# Patient Record
Sex: Male | Born: 1990 | Race: White | Hispanic: No | Marital: Married | Smoking: Never smoker
Health system: Southern US, Community
[De-identification: ages and names within clinical notes are randomized; demographics above are authoritative.]

## PROBLEM LIST (undated history)

## (undated) DIAGNOSIS — Z789 Other specified health status: Secondary | ICD-10-CM

## (undated) DIAGNOSIS — F988 Other specified behavioral and emotional disorders with onset usually occurring in childhood and adolescence: Secondary | ICD-10-CM

## (undated) HISTORY — DX: Other specified behavioral and emotional disorders with onset usually occurring in childhood and adolescence: F98.8

## (undated) HISTORY — DX: Other specified health status: Z78.9

---
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
## 2013-02-16 IMAGING — CR RIGHT FOOT COMPLETE - 3+ VIEW
1 series · 3 of 3 positions shown · non-contrast
Comparison: none

REASON FOR EXAM: foot pain and injury
COMMENTS:

PROCEDURE:     MDR - MDR FOOT RT COMP W/OBLIQUES  - July 23, 2011  [DATE]
RESULT:     Images of the right foot demonstrate no definite fracture,
dislocation or radiopaque foreign body.

[Series 1: ap · 0.17mm/px · 3 of 3 slices shown]
[im 1/3]
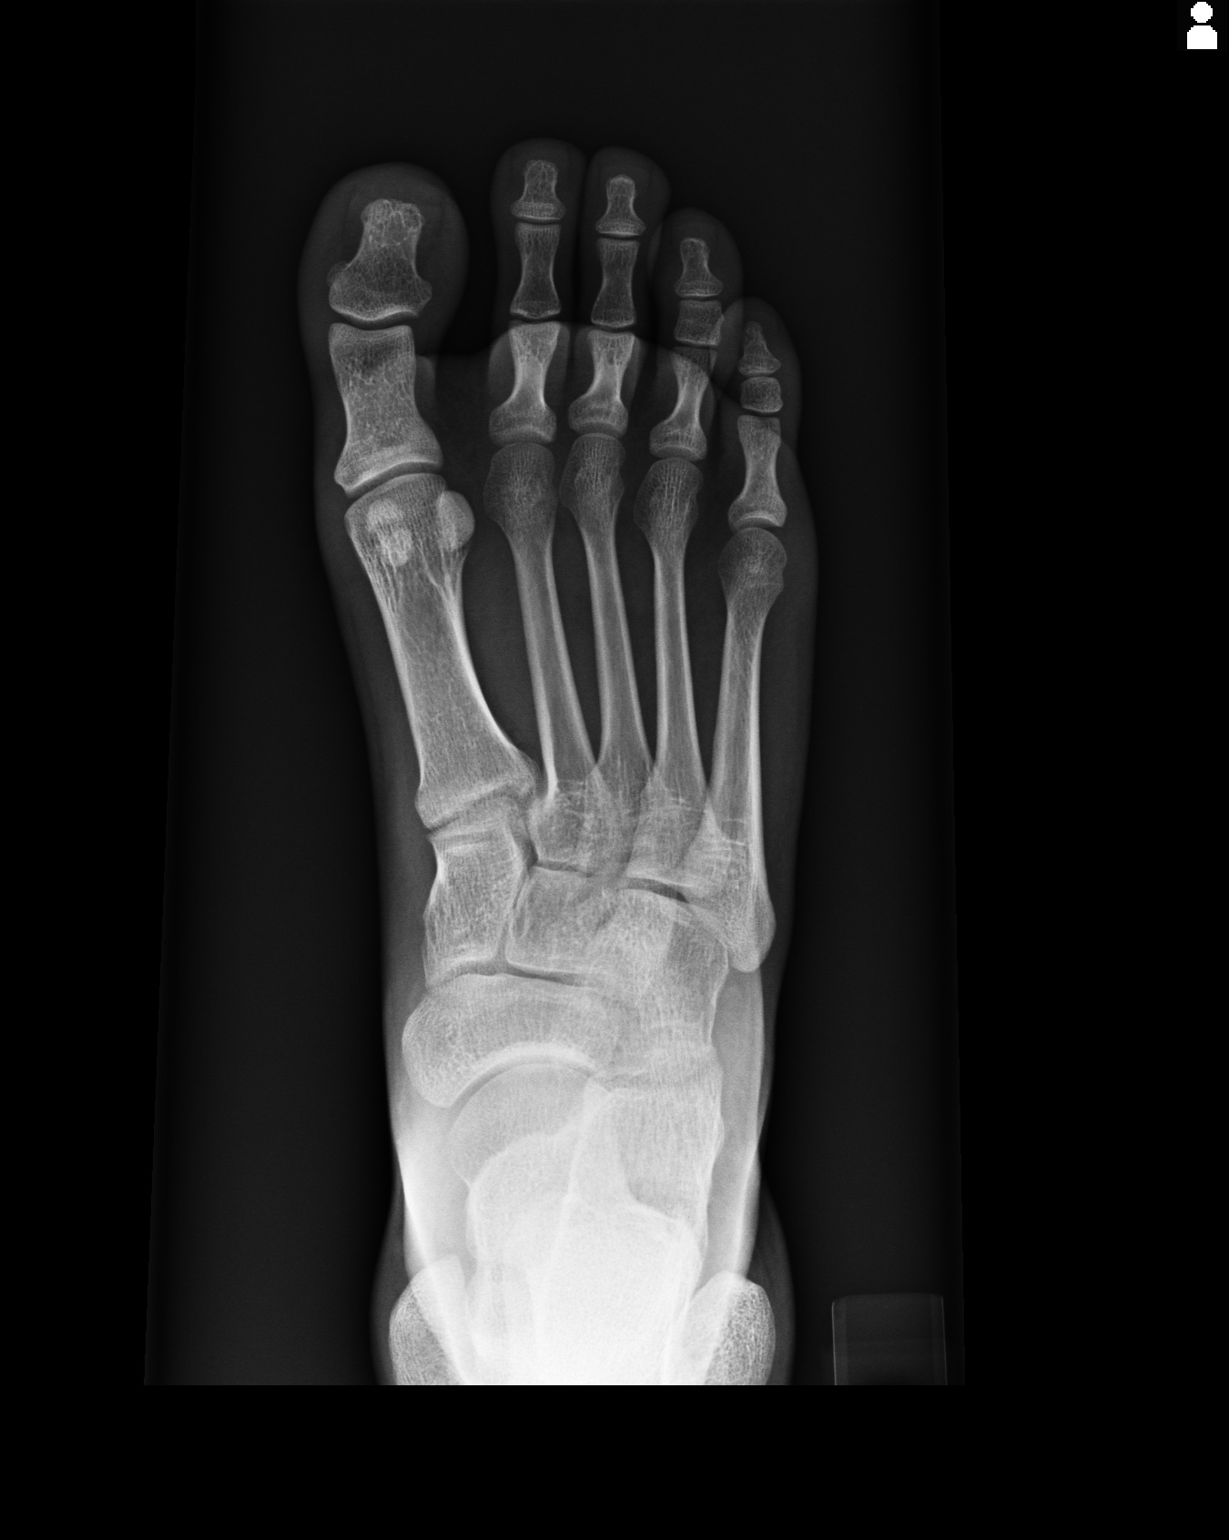
[im 2/3]
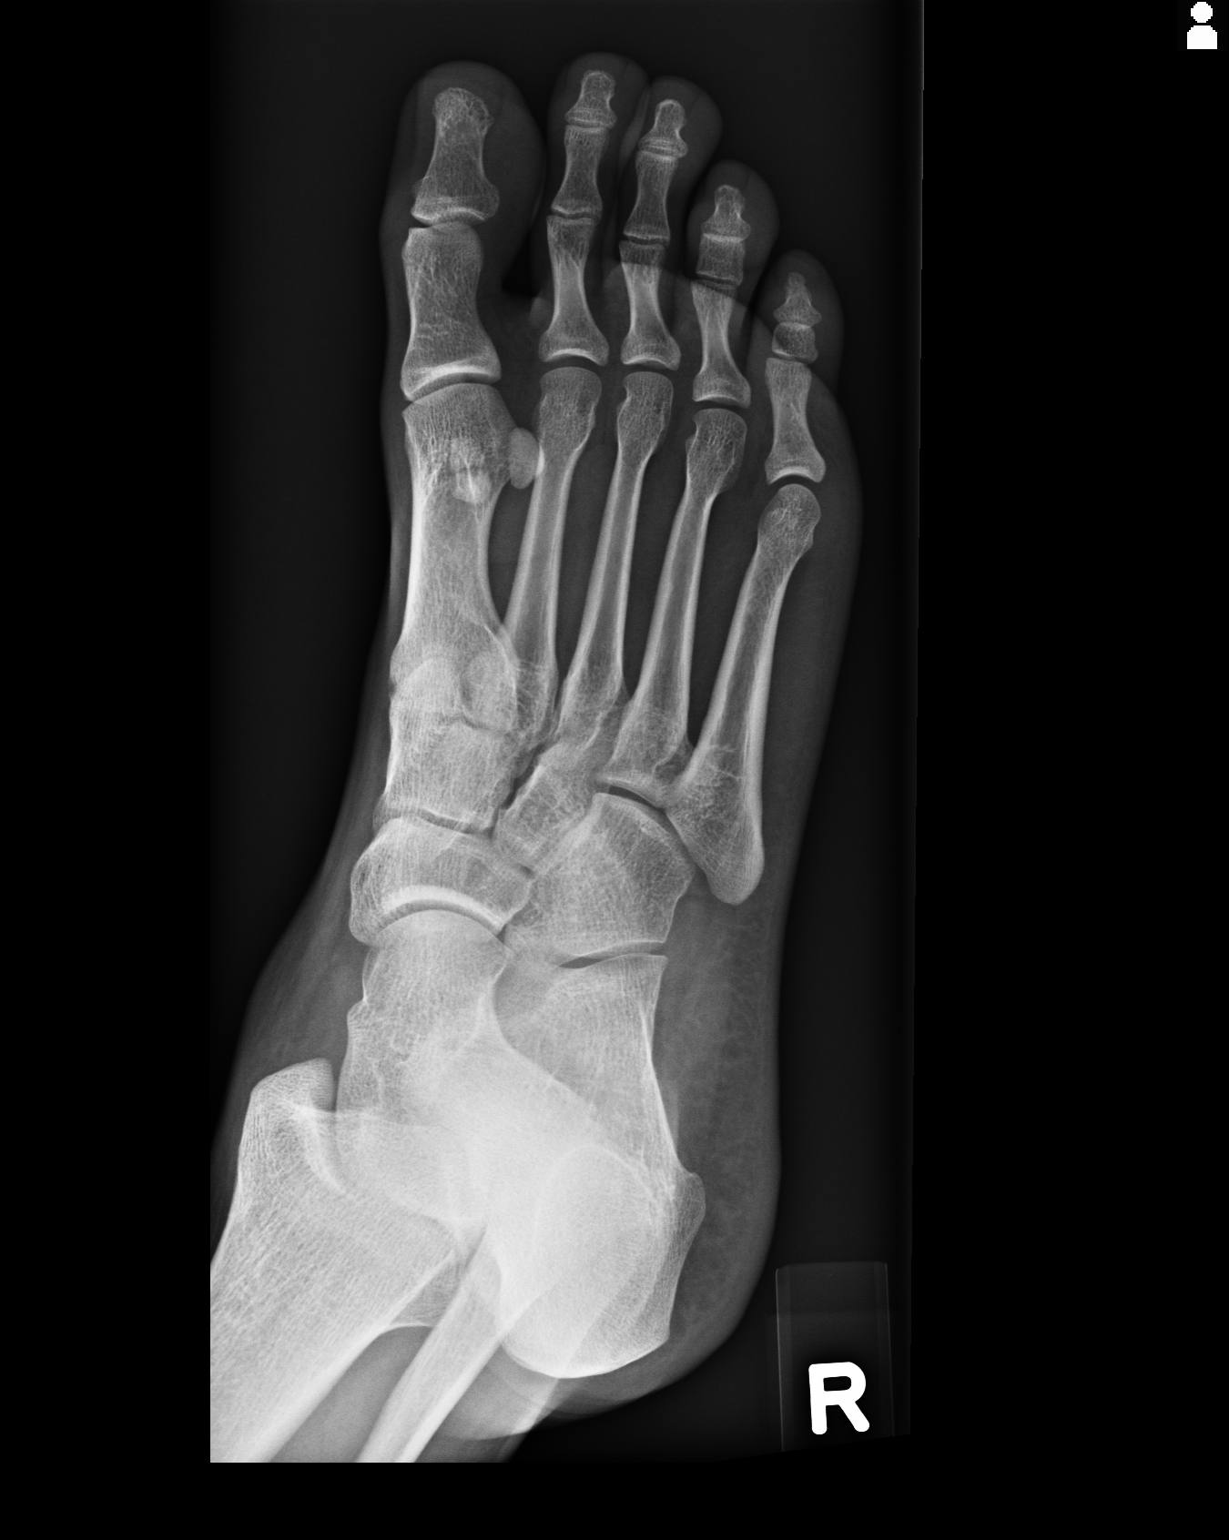
[im 3/3]
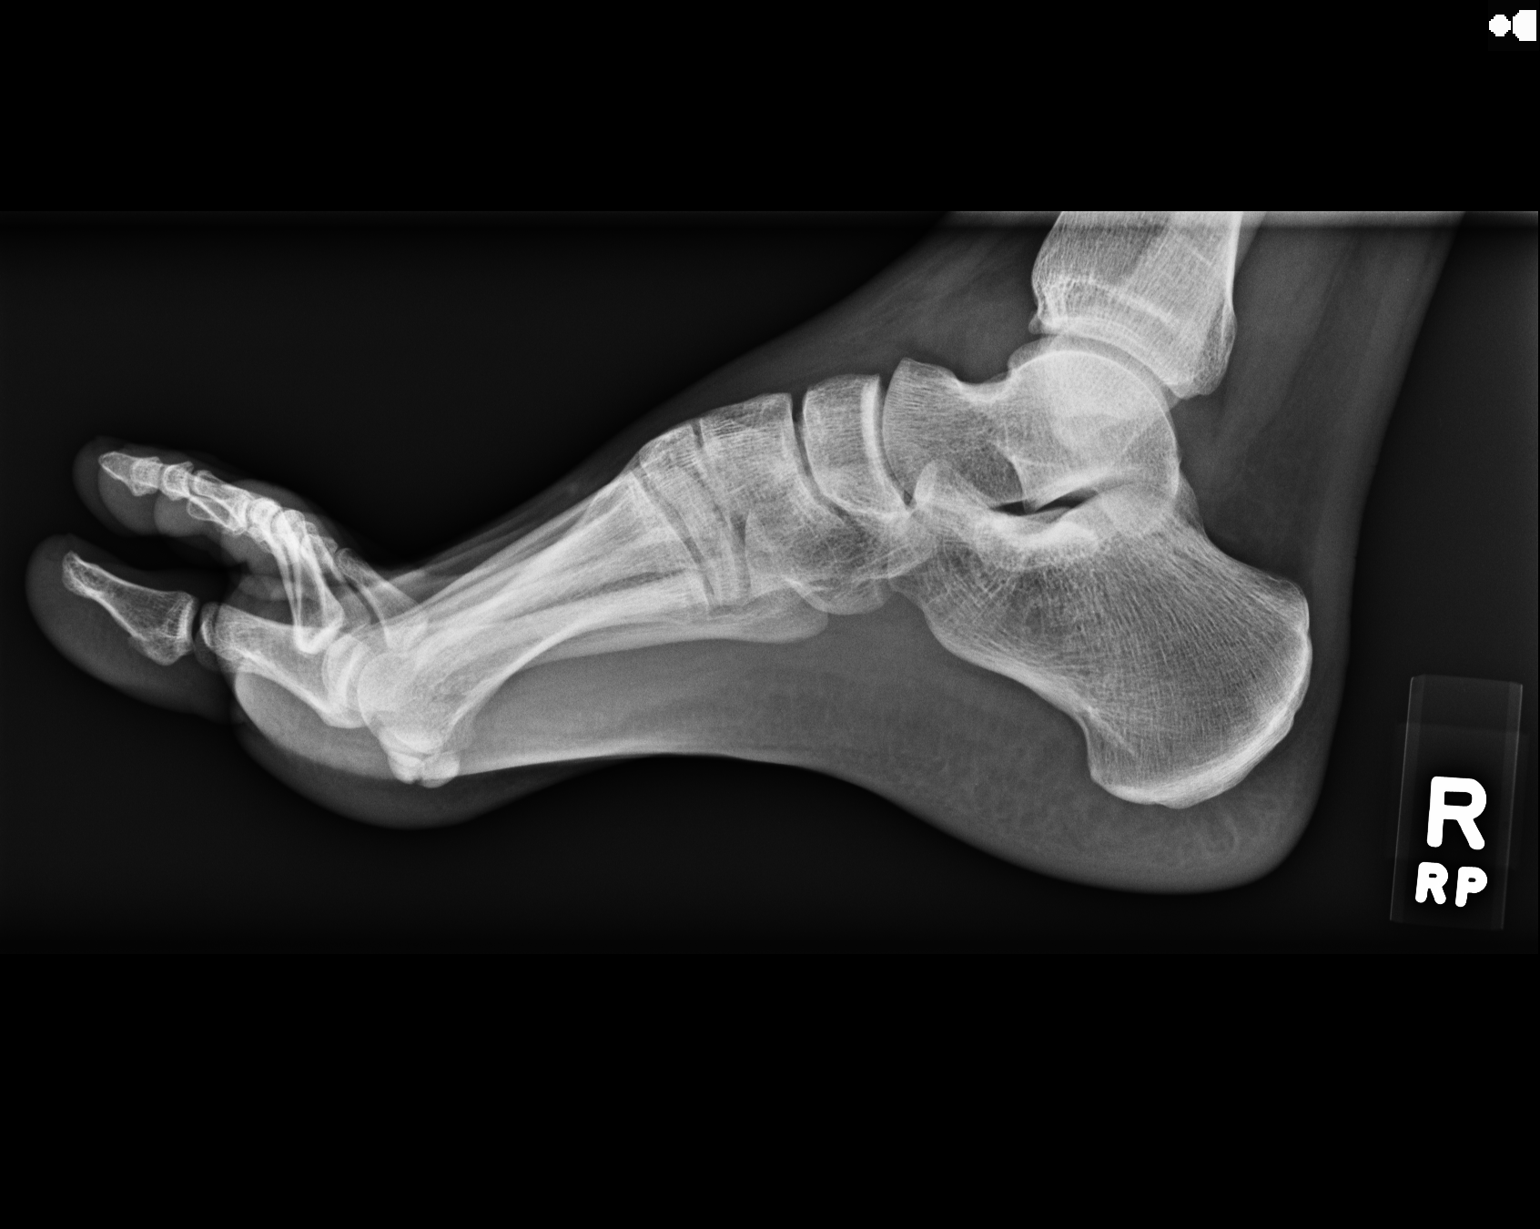

[3 of 3 positions shown; findings below may reference images not displayed]

IMPRESSION: Please see above.

## 2015-04-25 HISTORY — PX: UMBILICAL HERNIA REPAIR: SHX196

## 2017-06-30 LAB — LAB REPORT - SCANNED
ABO GROUPING: 0
ALT: 48
Cholesterol, Total: 176
HDL Cholesterol: 22.6
LDL CHOLESTEROL (CALC): 133
RH TYPE: POSITIVE
Triglycerides: 105

## 2017-08-03 ENCOUNTER — Ambulatory Visit: Payer: Self-pay | Admitting: Family Medicine

## 2017-08-09 ENCOUNTER — Ambulatory Visit (INDEPENDENT_AMBULATORY_CARE_PROVIDER_SITE_OTHER): Admitting: Family Medicine

## 2017-08-09 ENCOUNTER — Encounter: Payer: Self-pay | Admitting: Family Medicine

## 2017-08-09 VITALS — BP 116/84 | HR 74 | Temp 98.6°F | Ht 72.0 in | Wt 205.5 lb

## 2017-08-09 DIAGNOSIS — Z0000 Encounter for general adult medical examination without abnormal findings: Secondary | ICD-10-CM | POA: Diagnosis not present

## 2017-08-09 DIAGNOSIS — Z7189 Other specified counseling: Secondary | ICD-10-CM

## 2017-08-09 NOTE — Patient Instructions (Addendum)
[place]   Take care.  Glad to see you.  Update me as needed.

## 2017-08-12 ENCOUNTER — Encounter: Payer: Self-pay | Admitting: Family Medicine

## 2017-08-12 DIAGNOSIS — Z7189 Other specified counseling: Secondary | ICD-10-CM | POA: Insufficient documentation

## 2017-08-12 DIAGNOSIS — Z0000 Encounter for general adult medical examination without abnormal findings: Secondary | ICD-10-CM | POA: Insufficient documentation

## 2017-08-12 NOTE — Assessment & Plan Note (Addendum)
Tetanus 05/2017 Flu 2018 PNA and shingles not due Prostate and colon cancer screening not due.  Living will d/w pt.  Wife designated if patient were incapacitated.  Diet and exercise d/w pt, doing well with both.   [facility] report.  He'll get me a copy of labs done at work.

## 2017-08-12 NOTE — Progress Notes (Signed)
New patient to est care.   CPE- See plan.  Routine anticipatory guidance given to patient.  See health maintenance.  The possibility exists that previously documented standard health maintenance information may have been brought forward from a previous encounter into this note.  If needed, that same information has been updated to reflect the current situation based on today's encounter.    Tetanus 05/2017 Flu 2018 PNA and shingles not due Prostate and colon cancer screening not due.  Living will d/w pt.  Wife designated if patient were incapacitated.  Diet and exercise d/w pt, doing well with both.   [facility] per [facility] report.  He'll get me a copy of labs done at work.   PMH and SH reviewed  Meds, vitals, and allergies reviewed.   ROS: Per HPI.  Unless specifically indicated otherwise in HPI, the patient denies:  General: fever. Eyes: acute vision changes ENT: sore throat Cardiovascular: chest pain Respiratory: SOB GI: vomiting GU: dysuria Musculoskeletal: acute back pain Derm: acute rash Neuro: acute motor dysfunction Psych: worsening mood Endocrine: polydipsia Heme: bleeding Allergy: hayfever  GEN: nad, alert and oriented HEENT: mucous membranes moist NECK: supple w/o LA CV: rrr. PULM: ctab, no inc wob ABD: soft, +bs EXT: no edema SKIN: no acute rash

## 2017-08-12 NOTE — Assessment & Plan Note (Signed)
Living will d/w pt.  Wife designated if patient were incapacitated.   ?

## 2017-08-15 ENCOUNTER — Encounter: Payer: Self-pay | Admitting: Family Medicine

## 2017-08-15 ENCOUNTER — Ambulatory Visit (INDEPENDENT_AMBULATORY_CARE_PROVIDER_SITE_OTHER): Admitting: Family Medicine

## 2017-08-15 VITALS — BP 120/84 | HR 78 | Temp 98.2°F | Ht 72.0 in | Wt 208.5 lb

## 2017-08-15 DIAGNOSIS — J302 Other seasonal allergic rhinitis: Secondary | ICD-10-CM | POA: Diagnosis not present

## 2017-08-15 DIAGNOSIS — B353 Tinea pedis: Secondary | ICD-10-CM | POA: Diagnosis not present

## 2017-08-15 MED ORDER — CLOTRIMAZOLE-BETAMETHASONE 1-0.05 % EX CREA: 1.0000 "application " | TOPICAL_CREAM | Freq: Two times a day (BID) | CUTANEOUS | 0 refills | Status: DC

## 2017-08-15 MED ORDER — TERBINAFINE HCL 250 MG PO TABS: 250.0000 mg | ORAL_TABLET | Freq: Every day | ORAL | 0 refills | Status: DC

## 2017-08-15 MED ORDER — FLUTICASONE PROPIONATE 50 MCG/ACT NA SUSP: 2.0000 | Freq: Every day | NASAL | 6 refills | Status: DC

## 2017-08-15 NOTE — Assessment & Plan Note (Signed)
Worsened this year, breakthrough symptoms despite daily antihistamine. Discussed allergen avoidance. rec start flonase and use nasal saline irrigation throughout the day.

## 2017-08-15 NOTE — Assessment & Plan Note (Signed)
Recurrent persistent bilateral athlete's foot, also may have tinea manum. Rx lotrisone for feet, limited 2 wks at a time, and terbinafine 250mg  daily x 2 wks. Update with effect.

## 2017-08-15 NOTE — Patient Instructions (Addendum)
Work on allergen avoidance for seasonal allergies. Continue zyrtec. Add flonase - sent to pharmacy.  For athlete's foot - treat with topical combination antifungal/steroid cream sent to pharmacy - use twice daily for 2 weeks then stop. Also start taking oral antifungal sent to pharmacy. Update us with effect after a month.   Athlete's Foot Athlete's foot (tinea pedis) is a fungal infection of the skin on the feet. It often occurs on the skin that is between or underneath the toes. It can also occur on the soles of the feet. The infection can spread from person to person (is contagious). What are the causes? Athlete's foot is caused by a fungus. This fungus grows in warm, moist places. Most people get athlete's foot by sharing shower stalls, towels, and wet floors with someone who is infected. Not washing your feet or changing your socks often enough can contribute to athlete's foot. What increases the risk? This condition is more likely to develop in:  Men.  People who have a weak body defense system (immune system).  People who have diabetes.  People who use public showers, such as at a gym.  People who wear heavy-duty shoes, such as Youth workerindustrial or military shoes.  Seasons with warm, humid weather.  What are the signs or symptoms? Symptoms of this condition include:  Itchy areas between the toes or on the soles of the feet.  White, flaky, or scaly areas between the toes or on the soles of the feet.  Very itchy small blisters between the toes or on the soles of the feet.  Small cuts on the skin. These cuts can become infected.  Thick or discolored toenails.  How is this diagnosed? This condition is diagnosed with a medical history and physical exam. Your health care provider may also take a skin or toenail sample to be examined. How is this treated? Treatment for this condition includes antifungal medicines. These may be applied as powders, ointments, or creams. In severe cases,  an oral antifungal medicine may be given. Follow these instructions at home:  Apply or take over-the-counter and prescription medicines only as told by your health care provider.  Keep all follow-up visits as told by your health care provider. This is important.  Do not scratch your feet.  Keep your feet dry: ? Wear cotton or wool socks. Change your socks every day or if they become wet. ? Wear shoes that allow air to circulate, such as sandals or canvas tennis shoes.  Wash and dry your feet: ? Every day or as told by your health care provider. ? After exercising. ? Including the area between your toes.  Do not share towels, nail clippers, or other personal items that touch your feet with others.  If you have diabetes, keep your blood sugar under control. How is this prevented?  Do not share towels.  Wear sandals in wet areas, such as locker rooms and shared showers.  Keep your feet dry: ? Wear cotton or wool socks. Change your socks every day or if they become wet. ? Wear shoes that allow air to circulate, such as sandals or canvas tennis shoes.  Wash and dry your feet after exercising. Pay attention to the area between your toes. Contact a health care provider if:  You have a fever.  You have swelling, soreness, warmth, or redness in your foot.  You are not getting better with treatment.  Your symptoms get worse.  You have new symptoms. This information is not intended  to replace advice given to you by your health care provider. Make sure you discuss any questions you have with your health care provider. Document Released: 04/07/2000 Document Revised: 09/16/2015 Document Reviewed: 10/12/2014 Elsevier Interactive Patient Education  2018 [employer].

## 2017-08-15 NOTE — Progress Notes (Signed)
BP 120/84 (BP Location: Left Arm, Patient Position: Sitting, Cuff Size: Normal)   Pulse 78   Temp 98.2 F (36.8 C) (Oral)   Ht 6' (1.829 m)   Wt 208 lb 8 oz (94.6 kg)   SpO2 96%   BMI 28.28 kg/m    CC: itchy feet, worsening allergies Subjective:    Patient ID: Taylor Mcneil, male    DOB: January 16, 1991, 27 y.o.   MRN: [id number]  HPI: Taylor Mcneil is a 27 y.o. male presenting on 08/15/2017 for Foot issue (C/o constant itching and peeling/tearing skin for years. Tried OTC antifungal and rx antifungal, not helpful.  Tried wife's steroid cream for eczema that helped but did not resolve issue.) and Allergies (Has seasonal allergies.  Tried Zyrtec, not helping.)   Longstanding history of itchy rash on soles, peeling of feet. Ongoing for years, worse at night time. Tried antifungal cream prescribed by [facility] hospital twice daily for 1 year without benefit. Tried wife's eczema cream which temporarily helps itching. Denies itchy rash elsewhere - groin, hands.  Allergic rhinitis - worsening symptoms this season - this year has been especially bad for pine pollen. Nasal congestion, rhinorrhea, itchy watery eyes, sneezing. Some days are better than other. Worse around trees or working outdoors. Taking zyrtec daily x 2 months without benefit.   Relevant past medical, surgical, family and social history reviewed and updated as indicated. Interim medical history since our last visit reviewed. Allergies and medications reviewed and updated. Outpatient Medications Prior to Visit  Medication Sig Dispense Refill  . cetirizine (ZYRTEC) 10 MG tablet Take 10 mg by mouth daily.     No facility-administered medications prior to visit.      Per HPI unless specifically indicated in ROS section below Review of Systems     Objective:    BP 120/84 (BP Location: Left Arm, Patient Position: Sitting, Cuff Size: Normal)   Pulse 78   Temp 98.2 F (36.8 C) (Oral)   Ht 6' (1.829 m)   Wt 208 lb 8 oz (94.6 kg)    SpO2 96%   BMI 28.28 kg/m   Wt Readings from Last 3 Encounters:  08/15/17 208 lb 8 oz (94.6 kg)  08/09/17 205 lb 8 oz (93.2 kg)    Physical Exam  Constitutional: He appears well-developed and well-nourished. No distress.  Skin: Skin is warm and dry. Rash noted.  Dry skin throughout hands and feet, hyperkeratotic patches medial feet, interdigital maceration, fissure between great and 2nd toe on right, pruritic scaly soles  Nursing note and vitals reviewed.  No results found for this or any previous visit.    Assessment & Plan:   Problem List Items Addressed This Visit    Seasonal allergic rhinitis    Worsened this year, breakthrough symptoms despite daily antihistamine. Discussed allergen avoidance. rec start flonase and use nasal saline irrigation throughout the day.       Tinea pedis - Primary    Recurrent persistent bilateral athlete's foot, also may have tinea manum. Rx lotrisone for feet, limited 2 wks at a time, and terbinafine 250mg  daily x 2 wks. Update with effect.       Relevant Medications   clotrimazole-betamethasone (LOTRISONE) cream   terbinafine (LAMISIL) 250 MG tablet       Meds ordered this encounter  Medications  . fluticasone (FLONASE) 50 MCG/ACT nasal spray    Sig: Place 2 sprays into both nostrils daily.    Dispense:  16 g    Refill:  6  . clotrimazole-betamethasone (LOTRISONE) cream    Sig: Apply 1 application topically 2 (two) times daily. For max 2 wks at a time then may start plain lotrimin    Dispense:  45 g    Refill:  0  . terbinafine (LAMISIL) 250 MG tablet    Sig: Take 1 tablet (250 mg total) by mouth daily.    Dispense:  14 tablet    Refill:  0   No orders of the defined types were placed in this encounter.   Follow up plan: Return if symptoms worsen or fail to improve.  Taylor BoydenJavier Armetta Henri, MD

## 2017-08-16 ENCOUNTER — Telehealth: Payer: Self-pay | Admitting: Family Medicine

## 2017-08-16 MED ORDER — CETIRIZINE HCL 10 MG PO TABS: 10.0000 mg | ORAL_TABLET | Freq: Every day | ORAL | 3 refills | Status: DC

## 2017-08-16 NOTE — Telephone Encounter (Signed)
Amber notified as instructed and voiced understanding.

## 2017-08-16 NOTE — Telephone Encounter (Signed)
Copied from CRM [id number]. Topic: Quick Communication - Rx Refill/Question >> Aug 16, 2017  1:14 PM Maia Pettiesrtiz, Kristie S wrote: Medication: zyrtec was not sent to pharmacy with the other medications - please send in Has the patient contacted their pharmacy? Yes - not received Preferred Pharmacy (with phone number or street name): [facility] [phone] - [place] - [address] [facility] [address]

## 2017-08-16 NOTE — Telephone Encounter (Signed)
Noted  

## 2017-08-16 NOTE — Telephone Encounter (Signed)
Pt was seen 08/16/17; zyrtec not working; pt to use flonase and nasal saline irrigation.Please advise.

## 2017-08-16 NOTE — Telephone Encounter (Signed)
plz notify zyrtec sent in. Take with other meds as well.

## 2017-08-22 ENCOUNTER — Ambulatory Visit: Admitting: Family Medicine

## 2017-08-23 ENCOUNTER — Encounter: Payer: Self-pay | Admitting: Family Medicine

## 2018-09-27 ENCOUNTER — Other Ambulatory Visit: Payer: Self-pay | Admitting: Family Medicine

## 2018-12-03 ENCOUNTER — Ambulatory Visit (INDEPENDENT_AMBULATORY_CARE_PROVIDER_SITE_OTHER): Admitting: Family Medicine

## 2018-12-03 ENCOUNTER — Encounter: Payer: Self-pay | Admitting: Family Medicine

## 2018-12-03 ENCOUNTER — Other Ambulatory Visit: Payer: Self-pay

## 2018-12-03 DIAGNOSIS — M545 Low back pain, unspecified: Secondary | ICD-10-CM

## 2018-12-03 MED ORDER — TIZANIDINE HCL 4 MG PO TABS: 4.0000 mg | ORAL_TABLET | Freq: Three times a day (TID) | ORAL | 0 refills | Status: DC | PRN

## 2018-12-03 NOTE — Patient Instructions (Signed)
Use tizanidine and heat and update me as needed.  Likely a muscle strain/spasm.  Take care.  Glad to see you.

## 2018-12-03 NOTE — Progress Notes (Signed)
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  He was able to lay off lifting for 1 week.  He was better and able to go back to the gym. Then he was lifting a box at home and felt a pop. That was about 2 weeks ago.  He feels better standing and laying down.  More pain sitting or with toe touches.  No radicular pain. The pop was just above beltline to R of midline. The pain is local, in the same are.  No FCNAVD.  He tried ibuprofen, stretching, icy hot, heat.  Heat helps some.  Overall time line ~5 weeks.  He is on leave until 01/02/2019.  No numbness, no weakness.  No B/B sx.   He had reported issue to his department.    Meds, vitals, and allergies reviewed.   ROS: Per HPI unless specifically indicated in ROS section   nad ncat rrr ctab abd soft, not ttp Back not tender to palpation in the midline R lower back ttp.  No CVA pain No rash.  No bruising. Strength and sensation grossly normal for the bilateral lower extremities with negative straight leg raise bilaterally.

## 2018-12-04 DIAGNOSIS — M545 Low back pain, unspecified: Secondary | ICD-10-CM | POA: Insufficient documentation

## 2018-12-04 NOTE — Assessment & Plan Note (Signed)
No emergent symptoms.  No sciatica.  Discussed options.  Okay to defer imaging at this point Use tizanidine and heat and update me as needed.  Sedation caution on tizanidine. Likely a muscle strain/spasm.  He agrees with plan.

## 2018-12-10 ENCOUNTER — Emergency Department
Admission: EM | Admit: 2018-12-10 | Discharge: 2018-12-10 | Disposition: A | Discharge status: Home / Self Care | Attending: Emergency Medicine | Admitting: Emergency Medicine

## 2018-12-10 ENCOUNTER — Encounter: Payer: Self-pay | Admitting: Physician Assistant

## 2018-12-10 ENCOUNTER — Other Ambulatory Visit: Payer: Self-pay

## 2018-12-10 ENCOUNTER — Emergency Department

## 2018-12-10 DIAGNOSIS — M545 Low back pain, unspecified: Secondary | ICD-10-CM

## 2018-12-10 MED ORDER — PREDNISONE 10 MG (21) PO TBPK: ORAL_TABLET | ORAL | 0 refills | Status: DC

## 2018-12-10 MED ORDER — CYCLOBENZAPRINE HCL 10 MG PO TABS: 10.0000 mg | ORAL_TABLET | Freq: Three times a day (TID) | ORAL | 0 refills | Status: DC | PRN

## 2018-12-10 NOTE — ED Triage Notes (Signed)
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

## 2018-12-10 NOTE — Discharge Instructions (Addendum)
Follow-up with Dr. Lacinda Axon if not better in 3 to 5 days.  Please call for an appointment.  Return emergency department worsening.  Take medications as prescribed.  Apply ice to the lower back.  You could then start some back exercises as inflammation decreases.

## 2018-12-10 NOTE — ED Provider Notes (Signed)
[facility] Emergency Department Provider Note  ____________________________________________   None    (approximate)  I have reviewed the triage vital signs and the nursing notes.   HISTORY  Chief Complaint Back Pain    HPI Taylor Mcneil is a [age] y.o. male complains of low back pain.  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  He had some back pain for last 3 weeks while at work.  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  When he returned home he picks him up and felt a pop in his back.  To the right leg.  He has been taking ibuprofen and his regular doctor gave him Zanaflex which is not helped.  He denies any bowel or bladder changes.  No numbness or tingling.  Pain radiates to the right buttock only.    Past Medical History:  Diagnosis Date  . No pertinent past medical history     Patient Active Problem List   Diagnosis Date Noted  . Lower back pain 12/04/2018  . Tinea pedis 08/15/2017  . Seasonal allergic rhinitis 08/15/2017  . Routine general medical examination at a health care facility 08/12/2017  . Advance care planning 08/12/2017    Past Surgical History:  Procedure Laterality Date  . UMBILICAL HERNIA REPAIR  2017    Prior to Admission medications   Medication Sig Start Date End Date Taking? Authorizing Provider  cyclobenzaprine (FLEXERIL) 10 MG tablet Take 1 tablet (10 mg total) by mouth 3 (three) times daily as needed. 12/10/18   Finnigan Warriner, Roselyn BeringSusan W, PA-C  predniSONE (STERAPRED UNI-PAK 21 TAB) 10 MG (21) TBPK tablet Take 6 pills on day one then decrease by 1 pill each day 12/10/18   Faythe GheeFisher, Geni Skorupski W, PA-C  tiZANidine (ZANAFLEX) 4 MG tablet Take 1 tablet (4 mg total) by mouth 3 (three) times daily as needed for muscle spasms (sedation caution.). 12/03/18   Joaquim Namuncan, Graham S, MD    Allergies Patient has no known allergies.  Family History  Problem Relation Age of Onset  . Lung cancer Paternal Aunt        smoker  . Lung cancer  Paternal Grandmother        smoker  . Lung cancer Paternal Grandfather        smoker  . Colon cancer Neg Hx   . Prostate cancer Neg Hx     Social History Social History   Tobacco Use  . Smoking status: Never Smoker  . Smokeless tobacco: Never Used  Substance Use Topics  . Alcohol use: Never    Frequency: Never  . Drug use: Never    Review of Systems  Constitutional: No fever/chills Eyes: No visual changes. ENT: No sore throat. Respiratory: Denies cough Genitourinary: Negative for dysuria. Musculoskeletal: Positive for back pain. Skin: Negative for rash.    ____________________________________________   PHYSICAL EXAM:  VITAL SIGNS: ED Triage Vitals  Enc Vitals Group     BP 12/10/18 1029 127/78     Pulse Rate 12/10/18 1029 75     Resp 12/10/18 1029 16     Temp 12/10/18 1029 98.2 F (36.8 C)     Temp Source 12/10/18 1029 Oral     SpO2 12/10/18 1029 98 %     Weight 12/10/18 1024 191 lb (86.6 kg)     Height 12/10/18 1024 6\' 1"  (1.854 m)     Head Circumference --      Peak Flow --  Pain Score 12/10/18 1024 5     Pain Loc --      Pain Edu? --      Excl. in GC? --     Constitutional: Alert and oriented. Well appearing and in no acute distress. Eyes: Conjunctivae are normal.  Head: Atraumatic. Nose: No congestion/rhinnorhea. Mouth/Throat: Mucous membranes are moist.   Neck:  supple no lymphadenopathy noted Cardiovascular: Normal rate, regular rhythm.  Respiratory: Normal respiratory effort.  No retractions GU: deferred Musculoskeletal: FROM all extremities, warm and well perfused, lumbar spine is slightly tender to palpation, patient is able to stand on toes and heels without difficulty, neurovascular is intact, Neurologic:  Normal speech and language.  Skin:  Skin is warm, dry and intact. No rash noted. Psychiatric: Mood and affect are normal. Speech and behavior are normal.  ____________________________________________   LABS (all labs ordered  are listed, but only abnormal results are displayed)  Labs Reviewed - No data to display ____________________________________________   ____________________________________________  RADIOLOGY  X-ray of the lumbar spine does not show any acute abnormality  ____________________________________________   PROCEDURES  Procedure(s) performed: No  Procedures    ____________________________________________   INITIAL IMPRESSION / ASSESSMENT AND PLAN / ED COURSE  Pertinent labs & imaging results that were available during my care of the patient were reviewed by me and considered in my medical decision making (see chart for details).   Patient is a 28 year old male presents emergency department with complaints of low back pain.  Physical exam shows the patient to appear well.  Lumbar spine is tender.  Neurovascular is intact.  X-ray lumbar spine is negative for any acute abnormality  Explained findings to the patient.  He was placed on a Sterapred Dosepak and I changed his muscle relaxer to Flexeril.  He is to follow-[facility] if he is not better in 3 to 5 days.  Return emergency department if worsening.  Discussed signs and symptoms of cauda equina.  He states he understands and will comply.  Was discharged    [name] was evaluated in Emergency Department on 12/10/2018 for the symptoms described in the history of present illness. He was evaluated in the context of the global COVID-19 pandemic, which necessitated consideration that the patient might be at risk for infection with the SARS-CoV-2 virus that causes COVID-19. Institutional protocols and algorithms that pertain to the evaluation of patients at risk for COVID-19 are in a state of rapid change based on information released by regulatory bodies including the CDC and federal and state organizations. These policies and algorithms were followed during the patient's care in  the ED.   As part of my medical decision making, I reviewed the following data within the electronic MEDICAL RECORD NUMBER Nursing notes reviewed and incorporated, Old chart reviewed, Radiograph reviewed x-ray lumbar spine is negative, Notes from prior ED visits and [place] Controlled Substance Database  ____________________________________________   FINAL CLINICAL IMPRESSION(S) / ED DIAGNOSES  Final diagnoses:  Acute midline low back pain without sciatica      NEW MEDICATIONS STARTED DURING THIS VISIT:  Discharge Medication List as of 12/10/2018 11:24 AM    START taking these medications   Details  cyclobenzaprine (FLEXERIL) 10 MG tablet Take 1 tablet (10 mg total) by mouth 3 (three) times daily as needed., Starting Tue 12/10/2018, Normal    predniSONE (STERAPRED UNI-PAK 21 TAB) 10 MG (21) TBPK tablet Take 6 pills on day one then  decrease by 1 pill each day, Normal         Note:  This document was prepared using Dragon voice recognition software and may include unintentional dictation errors.    Versie Starks, PA-C 12/10/18 1132    Carrie Mew, MD 12/10/18 2033

## 2018-12-10 NOTE — ED Notes (Signed)
Pt discharged home after verbalizing understanding of discharge instructions; nad noted. 

## 2018-12-11 ENCOUNTER — Ambulatory Visit: Admitting: Family Medicine

## 2018-12-18 ENCOUNTER — Other Ambulatory Visit: Payer: Self-pay | Admitting: Student

## 2018-12-18 DIAGNOSIS — M5441 Lumbago with sciatica, right side: Secondary | ICD-10-CM

## 2018-12-27 ENCOUNTER — Ambulatory Visit

## 2019-02-13 ENCOUNTER — Ambulatory Visit (INDEPENDENT_AMBULATORY_CARE_PROVIDER_SITE_OTHER): Admitting: Family Medicine

## 2019-02-13 ENCOUNTER — Encounter: Payer: Self-pay | Admitting: Family Medicine

## 2019-02-13 ENCOUNTER — Other Ambulatory Visit: Payer: Self-pay

## 2019-02-13 VITALS — BP 136/82 | HR 68 | Temp 97.6°F | Ht 73.0 in | Wt 204.2 lb

## 2019-02-13 DIAGNOSIS — F988 Other specified behavioral and emotional disorders with onset usually occurring in childhood and adolescence: Secondary | ICD-10-CM | POA: Diagnosis not present

## 2019-02-13 MED ORDER — METHYLPHENIDATE HCL 10 MG PO TABS: 10.0000 mg | ORAL_TABLET | Freq: Two times a day (BID) | ORAL | 0 refills | Status: DC

## 2019-02-13 NOTE — Patient Instructions (Signed)
I would try taking ritalin 10mg  a day initially.  You can increase to twice a day if needed.  See if you can tolerate this, see if and how long it works and let me know.  Try to gradually cut back on caffeine.  Keep making lists.   Take care.  Glad to see you.

## 2019-02-13 NOTE — Progress Notes (Signed)
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    History of ADD.  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  He was on ritalin in high school.  He has been off medicine for about 8 years.  His wife wanted him to come in and discuss his situation.  He wanted to talk about it.  Persistently easily distracted.  Both he and his wife have noticed it.  He has trouble focusing with reading but has been able to compensate at work.  He is on a schedule, uses lists and reminders.  Sleeping "fine."  Mult doses of caffeine in a day at baseline.  No illicits.  His previous back pain resolved.  PMH and SH reviewed  ROS: Per HPI unless specifically indicated in ROS section   Meds, vitals, and allergies reviewed.   GEN: nad, alert and oriented HEENT: ncat NECK: supple w/o LA CV: rrr.  PULM: ctab, no inc wob ABD: soft, +bs EXT: no edema SKIN: no acute rash CN 2-12 wnl B, S/S/DTR wnl B

## 2019-02-16 ENCOUNTER — Encounter: Payer: Self-pay | Admitting: Family Medicine

## 2019-02-16 DIAGNOSIS — F988 Other specified behavioral and emotional disorders with onset usually occurring in childhood and adolescence: Secondary | ICD-10-CM | POA: Insufficient documentation

## 2019-02-16 NOTE — Assessment & Plan Note (Signed)
Longstanding, previously noted by patient.  Previously tested, positive, years ago.  He has been trying to compensate for his symptoms in the meantime.  He makes a list, uses reminders, gets exercise, has a healthy diet, but is still having significant trouble to the point where he wanted to come in and talk about it.  His wife has also noticed that he has difficulty with concentration.  Discussed options.  I do not think it makes sense to send him for testing again, since it was clearly positive per his report previously.  He is not using illicit medications.  Routine cautions given on stimulants.  We talked about stimulant versus nonstimulant medication.  I will start with methylphenidate 10 mg once a day and see if he can tolerate that.  He can try dosing it twice a day thereafter to see how much effect he has.  He will update me and we will go from there.  I want him to continue working on diet and exercise and using list, etc.  He agrees with plan.

## 2019-10-09 ENCOUNTER — Other Ambulatory Visit: Payer: Self-pay

## 2019-10-09 ENCOUNTER — Ambulatory Visit (INDEPENDENT_AMBULATORY_CARE_PROVIDER_SITE_OTHER): Admitting: Family Medicine

## 2019-10-09 ENCOUNTER — Encounter: Payer: Self-pay | Admitting: Family Medicine

## 2019-10-09 DIAGNOSIS — F988 Other specified behavioral and emotional disorders with onset usually occurring in childhood and adolescence: Secondary | ICD-10-CM

## 2019-10-09 MED ORDER — METHYLPHENIDATE HCL 20 MG PO TABS: 10.0000 mg | ORAL_TABLET | Freq: Two times a day (BID) | ORAL | 0 refills | Status: DC

## 2019-10-09 NOTE — Progress Notes (Signed)
This visit occurred during the SARS-CoV-2 public health emergency.  Safety protocols were in place, including screening questions prior to the visit, additional usage of staff PPE, and extensive cleaning of exam room while observing appropriate contact time as indicated for disinfecting solutions.  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    ADD f/u.  He thought it helped but he had limited effect.  No ADE on med.  Appetite and sleep are good.  No tremor.  He can tell some effect from med.  No tachycardia.  He is using his phone to stay organized.  Doing well with diet and exercise.  He has inc attention demands with schoolwork.  Med effect is a few hours.    Meds, vitals, and allergies reviewed.   ROS: Per HPI unless specifically indicated in ROS section   GEN: nad, alert and oriented HEENT: ncat NECK: supple w/o LA CV: rrr PULM: ctab, no inc wob ABD: soft, +bs EXT: no edema SKIN: well perfused.   No tremor BUE

## 2019-10-09 NOTE — Assessment & Plan Note (Signed)
Reasonable to try a higher dose of ritalin with inc in school demands.  No prev ADE on med.  Cautions d/w pt.  Continue work on diet and exercise, sleep schedule, etc.  He is making lists and trying to stay organized.  He agrees with plan.  He'll update me as needed.

## 2019-10-09 NOTE — Patient Instructions (Signed)
Try the higher dose of ritalin and let me know how that goes.  Take care.  Glad to see you.

## 2020-07-06 IMAGING — CR LUMBAR SPINE - 2-3 VIEW
1 series · 3 of 3 positions shown · non-contrast
Comparison: None

CLINICAL DATA: Low back pain, began on deployment, [REDACTED], then
returned after picking up something, felt a pop in his back, mainly
RIGHT-side pain now moving into RIGHT leg

EXAM:
LUMBAR SPINE - 2-3 VIEW

[Series 1: dg lumbar spine 2-3 views · 0.14mm/px · 3 of 3 slices shown]
[im 1/3]
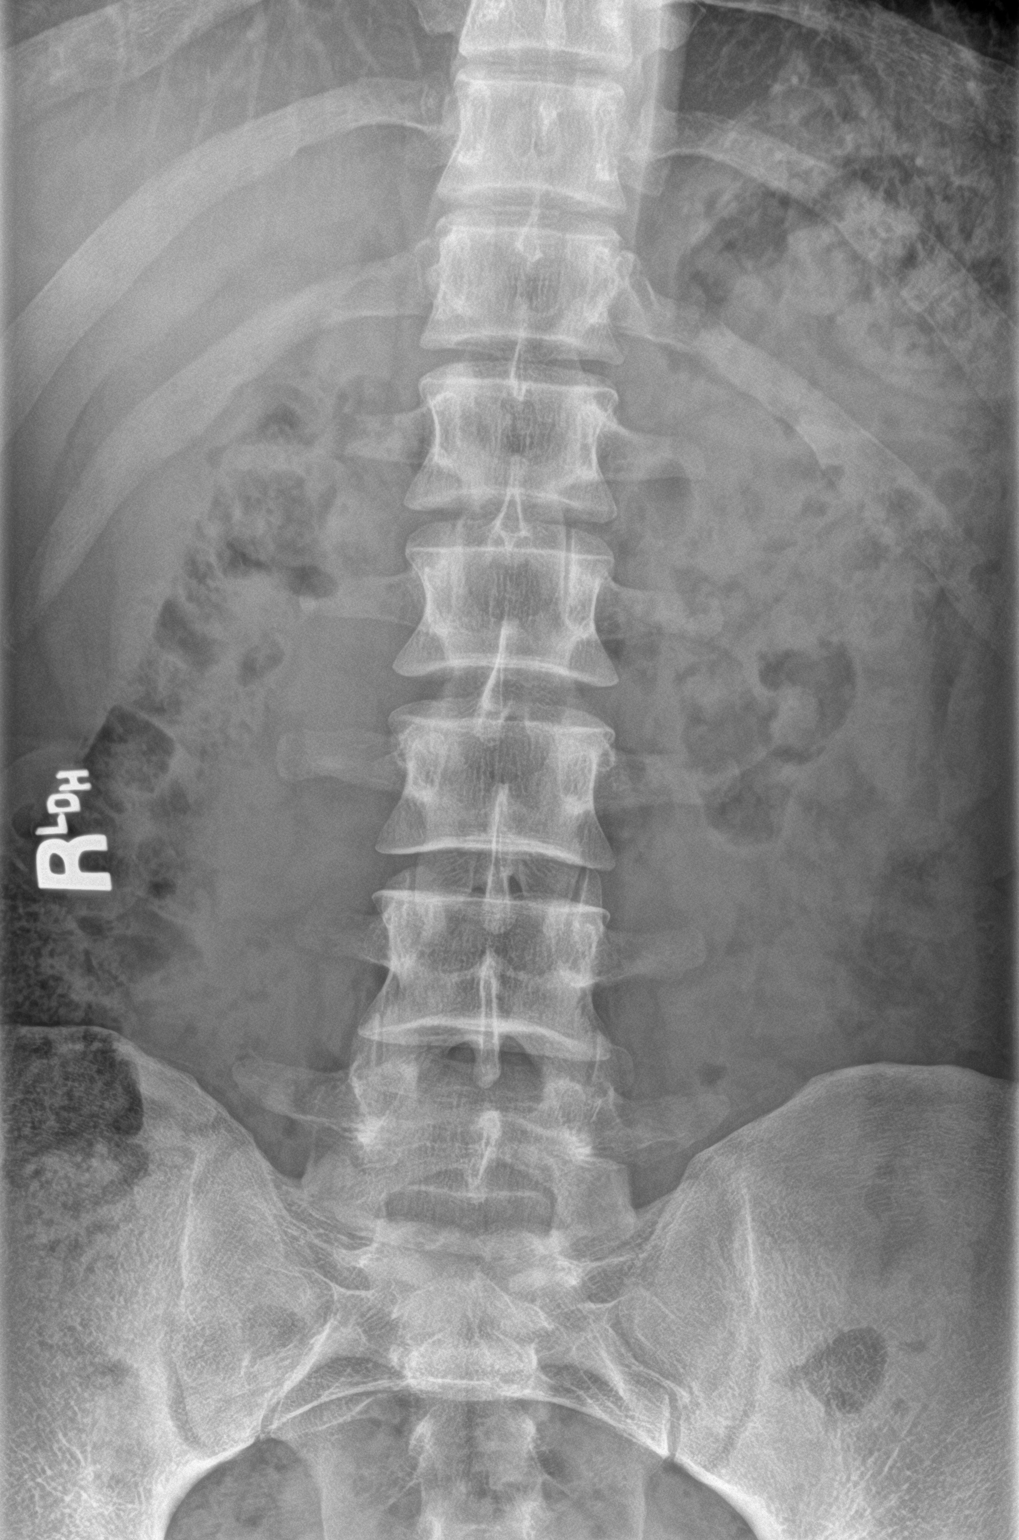
[im 2/3]
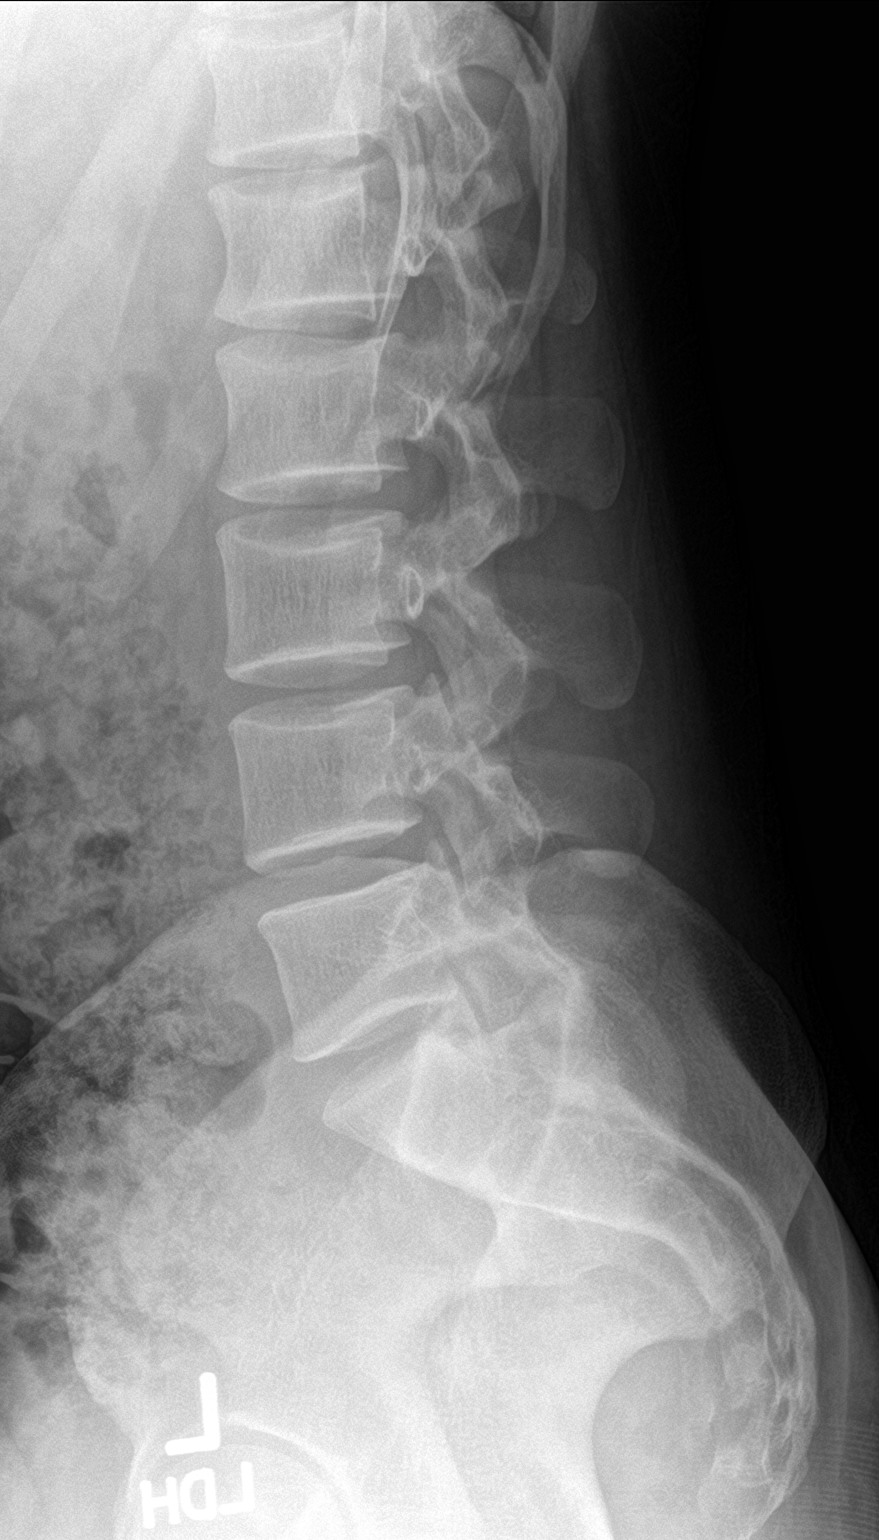
[im 3/3]
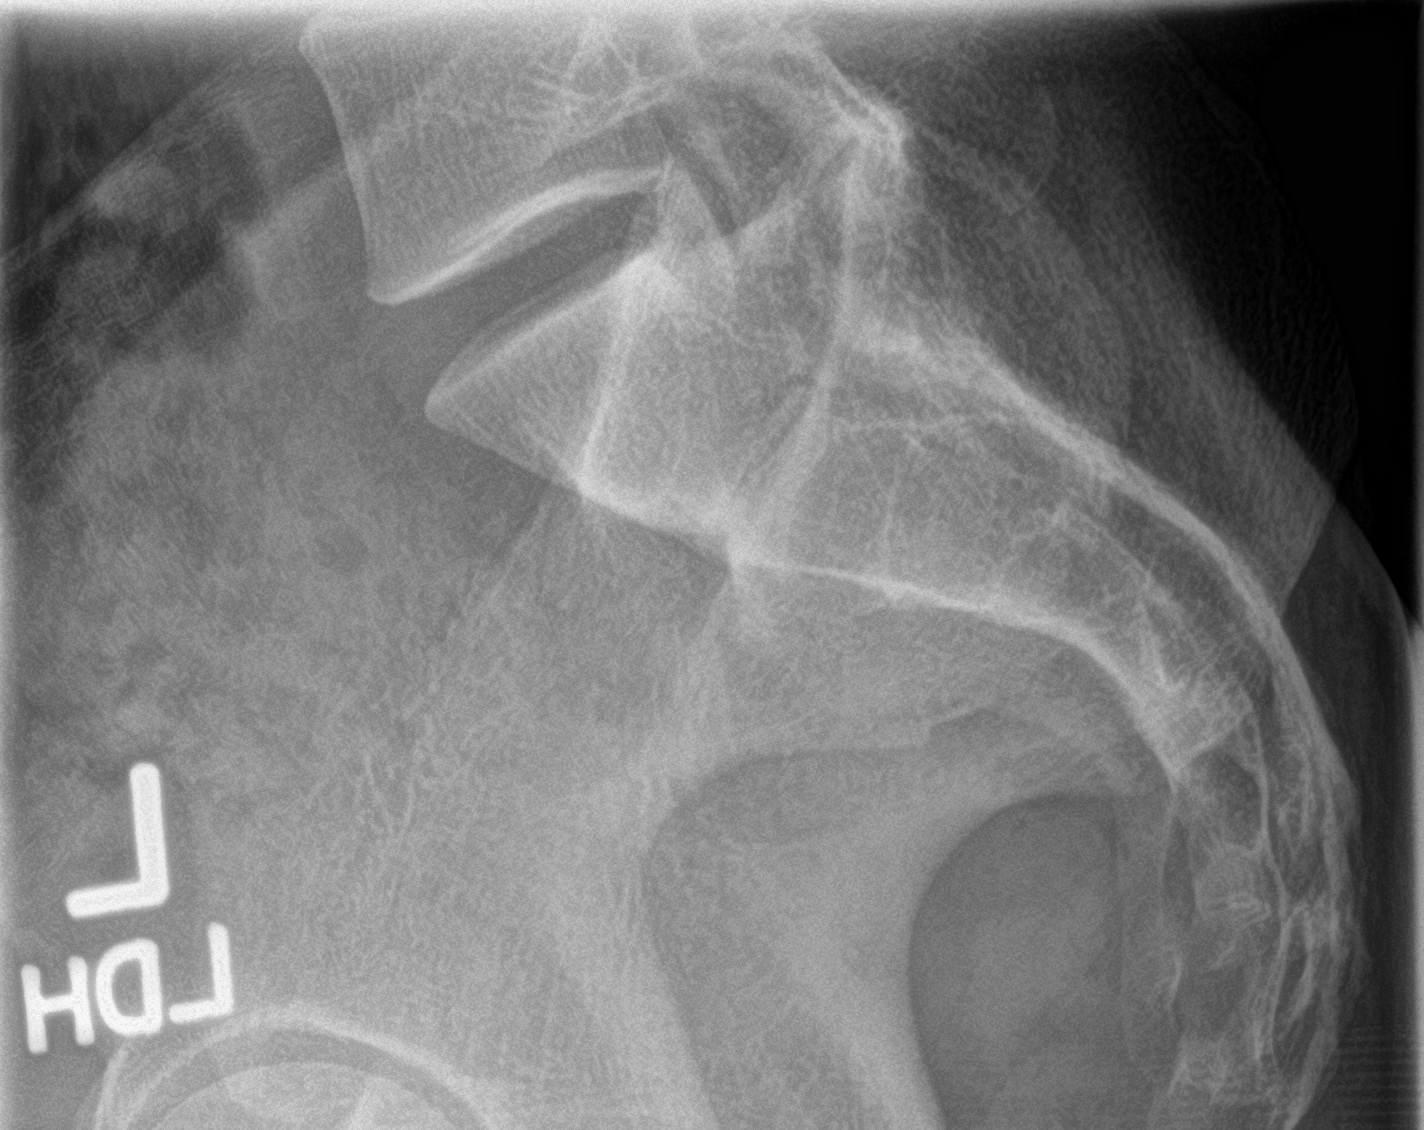

[3 of 3 positions shown; findings below may reference images not displayed]

FINDINGS: Osseous mineralization normal.

Five non-rib bearing lumbar vertebrae.

Vertebral body and disc space heights maintained.

Minimal retrolisthesis L5-S1.

No fracture, additional subluxation or bone destruction.

No obvious spondylolysis.

SI joints symmetric.
IMPRESSION: Minimal retrolisthesis L5-S1.

Otherwise negative exam.

## 2020-07-13 ENCOUNTER — Telehealth (INDEPENDENT_AMBULATORY_CARE_PROVIDER_SITE_OTHER): Admitting: Family Medicine

## 2020-07-13 DIAGNOSIS — R0982 Postnasal drip: Secondary | ICD-10-CM

## 2020-07-13 DIAGNOSIS — R0981 Nasal congestion: Secondary | ICD-10-CM

## 2020-07-13 NOTE — Progress Notes (Signed)
Virtual Visit via Video Note  I connected with Taylor Mcneil  on 07/13/20 at  3:40 PM EDT by a video enabled telemedicine application and verified that I am speaking with the correct person using two identifiers.  Location patient: home, [place] Location provider:work or home office Persons participating in the virtual visit: patient, provider  I discussed the limitations of evaluation and management by telemedicine and the availability of in person appointments. The patient expressed understanding and agreed to proceed.   HPI:  Acute telemedicine visit for cough and congestion: -Onset: 2-3 weeks ago - feels is seasonal allergies -Symptoms include: nasal congestion, pnd, cough -Denies: fevers, CP, SOB, facial pain, tooth, malaise -Pertinent past medical history: seasonal  allergies -Pertinent medication allergies:nkda   ROS: See pertinent positives and negatives per HPI.  Past Medical History:  Diagnosis Date  . ADD (attention deficit disorder)   . No pertinent past medical history     Past Surgical History:  Procedure Laterality Date  . UMBILICAL HERNIA REPAIR  2017     Current Outpatient Medications:  .  methylphenidate (RITALIN) 20 MG tablet, Take 0.5-1 tablets (10-20 mg total) by mouth 2 (two) times daily., Disp: 60 tablet, Rfl: 0  EXAM:  VITALS per patient if applicable:  GENERAL: alert, oriented, appears well and in no acute distress  HEENT: atraumatic, conjunttiva clear, no obvious abnormalities on inspection of external nose and ears  NECK: normal movements of the head and neck  LUNGS: on inspection no signs of respiratory distress, breathing rate appears normal, no obvious gross SOB, gasping or wheezing  CV: no obvious cyanosis  MS: moves all visible extremities without noticeable abnormality  PSYCH/NEURO: pleasant and cooperative, no obvious depression or anxiety, speech and thought processing grossly intact  ASSESSMENT AND PLAN:  Discussed the following  assessment and plan:  Nasal congestion  PND (post-nasal drip)  -we discussed possible serious and likely etiologies, options for evaluation and workup, limitations of telemedicine visit vs in person visit, treatment, treatment risks and precautions. Pt prefers to treat via telemedicine empirically rather than in person at this moment.  He feels this is likely allergies, as he gets this seasonally.  Reports this is the worst he has had it now.  He opted to try antihistamine such as Allegra or Zyrtec along with Flonase.  He can get these over-the-counter.  Advised follow-up with his primary care doctor in a few weeks to see how he is doing.  May need an allergy referral or step up to Singulair if not improving.ned Scheduled follow up with PCP offered: He agrees to contact primary care office for follow-up. Advised to seek prompt in person care if worsening, new symptoms arise, or if is not improving with treatment. Discussed options for inperson care if PCP office not available. Did let this patient know that I only do telemedicine on Tuesdays and Thursdays for [place]. Advised to schedule follow up visit with PCP or [facility] if any further questions or concerns to avoid delays in care.   I discussed the assessment and treatment plan with the patient. The patient was provided an opportunity to ask questions and all were answered. The patient agreed with the plan and demonstrated an understanding of the instructions.     Terressa Koyanagi, DO

## 2020-07-13 NOTE — Patient Instructions (Signed)
Please start Allegra or Zyrtec once daily.  Try Flonase 2 sprays each nostril daily for 3 weeks.  Schedule follow-up with your primary care office in about 2 weeks to check in.  I hope you are feeling better soon!  Seek a follow-up video visit through your primary care office or in person care sooner promptly if your symptoms worsen, new concerns arise or you are not improving with treatment.  It was nice to meet you today. I help [facility] out with telemedicine visits on Tuesdays and Thursdays and am available for visits on those days. If you have any concerns or questions following this visit please schedule a follow up visit with your Primary Care doctor or seek care at a local urgent care clinic to avoid delays in care.

## 2020-10-08 ENCOUNTER — Encounter: Payer: Self-pay | Admitting: Family Medicine

## 2020-10-08 ENCOUNTER — Ambulatory Visit (INDEPENDENT_AMBULATORY_CARE_PROVIDER_SITE_OTHER): Admitting: Family Medicine

## 2020-10-08 ENCOUNTER — Other Ambulatory Visit: Payer: Self-pay

## 2020-10-08 VITALS — BP 120/64 | HR 79 | Temp 97.6°F | Ht 73.0 in | Wt 206.0 lb

## 2020-10-08 DIAGNOSIS — R5383 Other fatigue: Secondary | ICD-10-CM

## 2020-10-08 LAB — BASIC METABOLIC PANEL WITH GFR
BUN: 28 mg/dL — ABNORMAL HIGH (ref 6–23)
CO2: 29 meq/L (ref 19–32)
Calcium: 10 mg/dL (ref 8.4–10.5)
Chloride: 100 meq/L (ref 96–112)
Creatinine, Ser: 1.35 mg/dL (ref 0.40–1.50)
GFR: 71.75 mL/min
Glucose, Bld: 83 mg/dL (ref 70–99)
Potassium: 4.8 meq/L (ref 3.5–5.1)
Sodium: 137 meq/L (ref 135–145)

## 2020-10-08 LAB — CBC WITH DIFFERENTIAL/PLATELET
Basophils Absolute: 0.1 K/uL (ref 0.0–0.1)
Basophils Relative: 0.8 % (ref 0.0–3.0)
Eosinophils Absolute: 0.1 K/uL (ref 0.0–0.7)
Eosinophils Relative: 1.2 % (ref 0.0–5.0)
HCT: 43.8 % (ref 39.0–52.0)
Hemoglobin: 14.7 g/dL (ref 13.0–17.0)
Lymphocytes Relative: 16 % (ref 12.0–46.0)
Lymphs Abs: 1.8 K/uL (ref 0.7–4.0)
MCHC: 33.4 g/dL (ref 30.0–36.0)
MCV: 89.3 fl (ref 78.0–100.0)
Monocytes Absolute: 0.8 K/uL (ref 0.1–1.0)
Monocytes Relative: 6.8 % (ref 3.0–12.0)
Neutro Abs: 8.6 K/uL — ABNORMAL HIGH (ref 1.4–7.7)
Neutrophils Relative %: 75.2 % (ref 43.0–77.0)
Platelets: 253 K/uL (ref 150.0–400.0)
RBC: 4.91 Mil/uL (ref 4.22–5.81)
RDW: 14.3 % (ref 11.5–15.5)
WBC: 11.5 K/uL — ABNORMAL HIGH (ref 4.0–10.5)

## 2020-10-08 LAB — TESTOSTERONE: Testosterone: 454.61 ng/dL (ref 300.00–890.00)

## 2020-10-08 LAB — TSH: TSH: 3.26 u[IU]/mL (ref 0.35–4.50)

## 2020-10-08 NOTE — Progress Notes (Signed)
This visit occurred during the SARS-CoV-2 public health emergency.  Safety protocols were in place, including screening questions prior to the visit, additional usage of staff PPE, and extensive cleaning of exam room while observing appropriate contact time as indicated for disinfecting solutions.  Taking ritalin when in school at [employer], off med currently.    Fatigue, not waking rested.  He had reached plateau with muscle gain with weight training.  Snoring some- no known apnea.  Less motivation.  Harder to get to sleep at night.  Not working a swing shift.    Meds, vitals, and allergies reviewed.   ROS: Per HPI unless specifically indicated in ROS section   GEN: nad, alert and oriented HEENT: ncat NECK: supple w/o LA CV: rrr PULM: ctab, no inc wob ABD: soft, +bs EXT: no edema SKIN: no acute rash 15.5" neck.

## 2020-10-08 NOTE — Patient Instructions (Signed)
Go to the lab on the way out.   If you have mychart we'll likely use that to update you.    Ask to see if you have any episodes with abnormal breathing at night. If so, let me know.   Take care.  Glad to see you.

## 2020-10-10 DIAGNOSIS — R5383 Other fatigue: Secondary | ICD-10-CM | POA: Insufficient documentation

## 2020-10-10 NOTE — Assessment & Plan Note (Signed)
Unclear source.  Check routine labs today.  See notes on labs.  I asked him to check with his wife and see if she has any noted abnormal breathing at night.  If so then I want him to update me.  He agrees with plan.  Okay for outpatient follow-up.

## 2021-02-21 ENCOUNTER — Other Ambulatory Visit: Payer: Self-pay | Admitting: Family Medicine

## 2021-02-22 MED ORDER — METHYLPHENIDATE HCL 20 MG PO TABS: 10.0000 mg | ORAL_TABLET | Freq: Two times a day (BID) | ORAL | 0 refills | Status: DC

## 2021-02-22 NOTE — Telephone Encounter (Signed)
Refill request for Ritalin 20 mg tablets  LOV - 10/08/20 Next OV - not scheduled Last refill - 10/09/19 #60/0

## 2021-02-22 NOTE — Telephone Encounter (Signed)
Sent. Thanks.   

## 2021-04-06 ENCOUNTER — Other Ambulatory Visit: Payer: Self-pay | Admitting: Family Medicine

## 2021-04-06 MED ORDER — METHYLPHENIDATE HCL 20 MG PO TABS: 10.0000 mg | ORAL_TABLET | Freq: Two times a day (BID) | ORAL | 0 refills | Status: DC

## 2021-04-06 NOTE — Telephone Encounter (Signed)
Sent. Thanks.   

## 2021-04-06 NOTE — Telephone Encounter (Signed)
Last OV 6/21/2

## 2021-06-14 ENCOUNTER — Encounter: Payer: Self-pay | Admitting: Emergency Medicine

## 2021-06-14 ENCOUNTER — Other Ambulatory Visit: Payer: Self-pay

## 2021-06-14 ENCOUNTER — Ambulatory Visit
Admission: EM | Admit: 2021-06-14 | Discharge: 2021-06-14 | Disposition: A | Discharge status: Home / Self Care | Attending: Emergency Medicine | Admitting: Emergency Medicine

## 2021-06-14 DIAGNOSIS — Z20818 Contact with and (suspected) exposure to other bacterial communicable diseases: Secondary | ICD-10-CM | POA: Diagnosis not present

## 2021-06-14 DIAGNOSIS — J029 Acute pharyngitis, unspecified: Secondary | ICD-10-CM

## 2021-06-14 LAB — POCT RAPID STREP A (OFFICE): Rapid Strep A Screen: NEGATIVE

## 2021-06-14 MED ORDER — AMOXICILLIN 875 MG PO TABS: 875.0000 mg | ORAL_TABLET | Freq: Two times a day (BID) | ORAL | 0 refills | Status: AC

## 2021-06-14 NOTE — Discharge Instructions (Addendum)
Take the amoxicillin as directed.  Follow up with your primary care provider if your symptoms are not improving.   ° ° °

## 2021-06-14 NOTE — ED Provider Notes (Signed)
[facility]    CSN: [id number] Arrival date & time: 06/14/21  1157      History   Chief Complaint Chief Complaint  Patient presents with   Sore Throat    HPI Dondi KEISON SIMICH is a 31 y.o. male.  Patient presents with sore throat this morning.  He also reports mild nasal congestion and low back pain since yesterday.  No treatments at home.  His daughter had strep throat last week.  He denies fever, rash, cough, shortness of breath, vomiting, diarrhea, or other symptoms.  The history is provided by the patient and medical records.   Past Medical History:  Diagnosis Date   ADD (attention deficit disorder)    No pertinent past medical history     Patient Active Problem List   Diagnosis Date Noted   Fatigue 10/10/2020   Attention deficit disorder 02/16/2019   Lower back pain 12/04/2018   Tinea pedis 08/15/2017   Seasonal allergic rhinitis 08/15/2017   Routine general medical examination at a health care facility 08/12/2017   Advance care planning 08/12/2017    Past Surgical History:  Procedure Laterality Date   UMBILICAL HERNIA REPAIR  2017       Home Medications    Prior to Admission medications   Medication Sig Start Date End Date Taking? Authorizing Provider  amoxicillin (AMOXIL) 875 MG tablet Take 1 tablet (875 mg total) by mouth 2 (two) times daily for 10 days. 06/14/21 06/24/21 Yes Sharion Balloon, NP  methylphenidate (RITALIN) 20 MG tablet Take 0.5-1 tablets (10-20 mg total) by mouth 2 (two) times daily. 04/06/21   Tonia Ghent, MD    Family History Family History  Problem Relation Age of Onset   Lung cancer Paternal Aunt        smoker   Lung cancer Paternal Grandmother        smoker   Lung cancer Paternal Grandfather        smoker   Colon cancer Neg Hx    Prostate cancer Neg Hx     Social History Social History   Tobacco Use   Smoking status: Never   Smokeless tobacco: Never  Substance Use Topics   Alcohol use: Never   Drug use:  Never     Allergies   Patient has no known allergies.   Review of Systems Review of Systems  Constitutional:  Negative for chills and fever.  HENT:  Positive for congestion and sore throat. Negative for ear pain.   Respiratory:  Negative for cough and shortness of breath.   Cardiovascular:  Negative for chest pain and palpitations.  Gastrointestinal:  Negative for diarrhea and vomiting.  Genitourinary:  Negative for dysuria and hematuria.  Musculoskeletal:  Positive for back pain. Negative for arthralgias.  Skin:  Negative for color change and rash.  All other systems reviewed and are negative.   Physical Exam Triage Vital Signs ED Triage Vitals  Enc Vitals Group     BP      Pulse      Resp      Temp      Temp src      SpO2      Weight      Height      Head Circumference      Peak Flow      Pain Score      Pain Loc      Pain Edu?      Excl. in Jud?  No data found.  Updated Vital Signs BP (!) 143/83    Pulse 70    Temp 97.9 F (36.6 C)    Resp 18    SpO2 98%   Visual Acuity Right Eye Distance:   Left Eye Distance:   Bilateral Distance:    Right Eye Near:   Left Eye Near:    Bilateral Near:     Physical Exam Vitals and nursing note reviewed.  Constitutional:      General: He is not in acute distress.    Appearance: Normal appearance. He is well-developed. He is not ill-appearing.  HENT:     Right Ear: Tympanic membrane normal.     Left Ear: Tympanic membrane normal.     Nose: Nose normal.     Mouth/Throat:     Mouth: Mucous membranes are moist.     Pharynx: Posterior oropharyngeal erythema present.  Cardiovascular:     Rate and Rhythm: Normal rate and regular rhythm.     Heart sounds: Normal heart sounds.  Pulmonary:     Effort: Pulmonary effort is normal. No respiratory distress.     Breath sounds: Normal breath sounds.  Abdominal:     Palpations: Abdomen is soft.     Tenderness: There is no abdominal tenderness.  Musculoskeletal:      Cervical back: Neck supple.  Skin:    General: Skin is warm and dry.  Neurological:     Mental Status: He is alert.  Psychiatric:        Mood and Affect: Mood normal.        Behavior: Behavior normal.     UC Treatments / Results  Labs (all labs ordered are listed, but only abnormal results are displayed) Labs Reviewed  POCT RAPID STREP A (OFFICE)    EKG   Radiology No results found.  Procedures Procedures (including critical care time)  Medications Ordered in UC Medications - No data to display  Initial Impression / Assessment and Plan / UC Course  I have reviewed the triage vital signs and the nursing notes.  Pertinent labs & imaging results that were available during my care of the patient were reviewed by me and considered in my medical decision making (see chart for details).    Exposure to strep, sore throat.  Based on exam today and patient's exposure to strep at home, treating with amoxicillin.  Discussed Tylenol or ibuprofen as needed.  Instructed patient to follow-up with his PCP if symptoms are not improving.  He agrees to plan of care.  Final Clinical Impressions(s) / UC Diagnoses   Final diagnoses:  Exposure to strep throat  Sore throat     Discharge Instructions      Take the amoxicillin as directed.  Follow up with your primary care provider if your symptoms are not improving.         ED Prescriptions     Medication Sig Dispense Auth. Provider   amoxicillin (AMOXIL) 875 MG tablet Take 1 tablet (875 mg total) by mouth 2 (two) times daily for 10 days. 20 tablet Sharion Balloon, NP      PDMP not reviewed this encounter.   Sharion Balloon, NP 06/14/21 [phone]

## 2021-06-14 NOTE — ED Triage Notes (Signed)
Provider triaged.  

## 2021-08-18 ENCOUNTER — Encounter: Payer: Self-pay | Admitting: Emergency Medicine

## 2021-08-18 ENCOUNTER — Ambulatory Visit: Admitting: Family

## 2021-08-18 ENCOUNTER — Ambulatory Visit
Admission: EM | Admit: 2021-08-18 | Discharge: 2021-08-18 | Disposition: A | Discharge status: Home / Self Care | Attending: Emergency Medicine | Admitting: Emergency Medicine

## 2021-08-18 DIAGNOSIS — H109 Unspecified conjunctivitis: Secondary | ICD-10-CM

## 2021-08-18 DIAGNOSIS — L03213 Periorbital cellulitis: Secondary | ICD-10-CM

## 2021-08-18 MED ORDER — AMOXICILLIN-POT CLAVULANATE 875-125 MG PO TABS: 1.0000 | ORAL_TABLET | Freq: Two times a day (BID) | ORAL | 0 refills | Status: DC

## 2021-08-18 NOTE — ED Triage Notes (Signed)
Pt here with right eye swelling and redness x 4 days.  ?

## 2021-08-18 NOTE — ED Provider Notes (Signed)
?[facility] ? ? ? ?CSN: [id number] ?Arrival date & time: 08/18/21  4098 ? ? ?  ? ?History   ?Chief Complaint ?Chief Complaint  ?Patient presents with  ? Eye Problem  ? ? ?HPI ?Taylor Mcneil is a 31 y.o. male.  Patient presents with right eye redness and yellow-green mucus drainage with crusting in his lashes since 08/14/2021.  The eye drainage has improved with antibiotic eye drops but he has developed redness and swelling of his right eyelids.  No eye injury.  No acute eye pain or changes in vision.  No fever, chills, or other symptoms.  His daughter was diagnosed with pinkeye last week and treated with an antibiotic eyedrop; The patient has been using these antibiotic eyedrops (name unknown) since 08/14/2021.  His medical history includes seasonal allergies and ADD. ? ?The history is provided by the patient and medical records.  ? ?Past Medical History:  ?Diagnosis Date  ? ADD (attention deficit disorder)   ? No pertinent past medical history   ? ? ?Patient Active Problem List  ? Diagnosis Date Noted  ? Fatigue 10/10/2020  ? Attention deficit disorder 02/16/2019  ? Lower back pain 12/04/2018  ? Tinea pedis 08/15/2017  ? Seasonal allergic rhinitis 08/15/2017  ? Routine general medical examination at a health care facility 08/12/2017  ? Advance care planning 08/12/2017  ? ? ?Past Surgical History:  ?Procedure Laterality Date  ? UMBILICAL HERNIA REPAIR  2017  ? ? ? ? ? ?Home Medications   ? ?Prior to Admission medications   ?Medication Sig Start Date End Date Taking? Authorizing Provider  ?amoxicillin-clavulanate (AUGMENTIN) 875-125 MG tablet Take 1 tablet by mouth every 12 (twelve) hours. 08/18/21  Yes Mickie Bail, NP  ?methylphenidate (RITALIN) 20 MG tablet Take 0.5-1 tablets (10-20 mg total) by mouth 2 (two) times daily. 04/06/21   Joaquim Nam, MD  ? ? ?Family History ?Family History  ?Problem Relation Age of Onset  ? Lung cancer Paternal Aunt   ?     smoker  ? Lung cancer Paternal Grandmother   ?      smoker  ? Lung cancer Paternal Grandfather   ?     smoker  ? Colon cancer Neg Hx   ? Prostate cancer Neg Hx   ? ? ?Social History ?Social History  ? ?Tobacco Use  ? Smoking status: Never  ? Smokeless tobacco: Never  ?Substance Use Topics  ? Alcohol use: Never  ? Drug use: Never  ? ? ? ?Allergies   ?Patient has no known allergies. ? ? ?Review of Systems ?Review of Systems  ?Constitutional:  Negative for chills and fever.  ?HENT:  Negative for ear pain and sore throat.   ?Eyes:  Positive for discharge and redness. Negative for pain and visual disturbance.  ?Respiratory:  Negative for cough and shortness of breath.   ?Cardiovascular:  Negative for chest pain and palpitations.  ?Gastrointestinal:  Negative for diarrhea and vomiting.  ?Skin:  Negative for color change and rash.  ?All other systems reviewed and are negative. ? ? ?Physical Exam ?Triage Vital Signs ?ED Triage Vitals  ?Enc Vitals Group  ?   BP   ?   Pulse   ?   Resp   ?   Temp   ?   Temp src   ?   SpO2   ?   Weight   ?   Height   ?   Head Circumference   ?  Peak Flow   ?   Pain Score   ?   Pain Loc   ?   Pain Edu?   ?   Excl. in GC?   ? ?No data found. ? ?Updated Vital Signs ?BP (!) 149/82   Pulse 72   Temp 98.1 ?F (36.7 ?C)   Resp 18   SpO2 97%  ? ?Visual Acuity ?Right Eye Distance: 20/30 ?Left Eye Distance: 20/40 ?Bilateral Distance: 20/30 ? ?Right Eye Near:   ?Left Eye Near:    ?Bilateral Near:    ? ?Physical Exam ?Vitals and nursing note reviewed.  ?Constitutional:   ?   General: He is not in acute distress. ?   Appearance: He is well-developed. He is not ill-appearing.  ?HENT:  ?   Right Ear: Tympanic membrane normal.  ?   Left Ear: Tympanic membrane normal.  ?   Nose: Nose normal.  ?   Mouth/Throat:  ?   Mouth: Mucous membranes are moist.  ?   Pharynx: Oropharynx is clear.  ?Eyes:  ?   General: Vision grossly intact.  ?   Extraocular Movements: Extraocular movements intact.  ?   Conjunctiva/sclera:  ?   Right eye: Right conjunctiva is injected.   ?   Left eye: Left conjunctiva is injected.  ?   Pupils: Pupils are equal, round, and reactive to light.  ?   Comments: Right upper and lower eyelids mildly erythematous and mildly edematous.  Conjunctiva injected, right > left.  Scant yellow-green crusting in lashes.  ?Cardiovascular:  ?   Rate and Rhythm: Normal rate and regular rhythm.  ?   Heart sounds: Normal heart sounds.  ?Pulmonary:  ?   Effort: Pulmonary effort is normal. No respiratory distress.  ?   Breath sounds: Normal breath sounds.  ?Musculoskeletal:  ?   Cervical back: Neck supple.  ?Skin: ?   General: Skin is warm and dry.  ?Neurological:  ?   Mental Status: He is alert.  ?Psychiatric:     ?   Mood and Affect: Mood normal.     ?   Behavior: Behavior normal.  ? ? ? ?UC Treatments / Results  ?Labs ?(all labs ordered are listed, but only abnormal results are displayed) ?Labs Reviewed - No data to display ? ?EKG ? ? ?Radiology ?No results found. ? ?Procedures ?Procedures (including critical care time) ? ?Medications Ordered in UC ?Medications - No data to display ? ?Initial Impression / Assessment and Plan / UC Course  ?I have reviewed the triage vital signs and the nursing notes. ? ?Pertinent labs & imaging results that were available during my care of the patient were reviewed by me and considered in my medical decision making (see chart for details). ? ?  ?Preseptal cellulitis of right eye, bilateral conjunctivitis.  Patient has been using antibiotic eyedrops that were prescribed for his daughter for the past 5 days.  He has had improvement in the drainage from his eye but has developed redness and swelling around his right eye.  His conjunctiva continue to be injected, worse on the right.  No acute eye pain or changes in his vision.  No eye injury.  Treating today with Augmentin.  Strict ED precautions discussed.  Instructed patient to go to the ED if he notes signs of worsening infection or develops other new concerning symptoms.  Education  provided on preseptal cellulitis and conjunctivitis.  Patient agrees to plan of care. ? ?Final Clinical Impressions(s) / UC Diagnoses  ? ?  Final diagnoses:  ?Preseptal cellulitis of right eye  ?Conjunctivitis of both eyes, unspecified conjunctivitis type  ? ? ? ?Discharge Instructions   ? ?  ?Take the Augmentin as directed.  Follow up with your primary care provider if your symptoms are not improving.   ? ? ? ? ? ?ED Prescriptions   ? ? Medication Sig Dispense Auth. Provider  ? amoxicillin-clavulanate (AUGMENTIN) 875-125 MG tablet Take 1 tablet by mouth every 12 (twelve) hours. 14 tablet Mickie Bailate, Suzetta Timko H, NP  ? ?  ? ?PDMP not reviewed this encounter. ?  ?Mickie Bailate, Makela Niehoff H, NP ?08/18/21 1015 ? ?

## 2021-08-18 NOTE — Discharge Instructions (Addendum)
Take the Augmentin as directed.  Follow up with your primary care provider if your symptoms are not improving.    

## 2021-09-01 ENCOUNTER — Ambulatory Visit: Admitting: Family Medicine

## 2021-10-03 DIAGNOSIS — M778 Other enthesopathies, not elsewhere classified: Secondary | ICD-10-CM | POA: Insufficient documentation

## 2021-10-03 DIAGNOSIS — M25512 Pain in left shoulder: Secondary | ICD-10-CM | POA: Insufficient documentation

## 2021-10-03 DIAGNOSIS — M19012 Primary osteoarthritis, left shoulder: Secondary | ICD-10-CM | POA: Insufficient documentation

## 2021-10-18 ENCOUNTER — Ambulatory Visit: Admitting: Family Medicine

## 2022-01-13 ENCOUNTER — Ambulatory Visit
Admission: EM | Admit: 2022-01-13 | Discharge: 2022-01-13 | Disposition: A | Discharge status: Home / Self Care | Attending: Urgent Care | Admitting: Urgent Care

## 2022-01-13 DIAGNOSIS — R059 Cough, unspecified: Secondary | ICD-10-CM | POA: Insufficient documentation

## 2022-01-13 DIAGNOSIS — J3489 Other specified disorders of nose and nasal sinuses: Secondary | ICD-10-CM

## 2022-01-13 DIAGNOSIS — R051 Acute cough: Secondary | ICD-10-CM | POA: Diagnosis not present

## 2022-01-13 DIAGNOSIS — J018 Other acute sinusitis: Secondary | ICD-10-CM

## 2022-01-13 DIAGNOSIS — R0982 Postnasal drip: Secondary | ICD-10-CM

## 2022-01-13 DIAGNOSIS — R519 Headache, unspecified: Secondary | ICD-10-CM | POA: Diagnosis not present

## 2022-01-13 DIAGNOSIS — J309 Allergic rhinitis, unspecified: Secondary | ICD-10-CM

## 2022-01-13 MED ORDER — PROMETHAZINE-DM 6.25-15 MG/5ML PO SYRP: 5.0000 mL | ORAL_SOLUTION | Freq: Three times a day (TID) | ORAL | 0 refills | Status: DC | PRN

## 2022-01-13 MED ORDER — PSEUDOEPHEDRINE HCL 60 MG PO TABS: 60.0000 mg | ORAL_TABLET | Freq: Three times a day (TID) | ORAL | 0 refills | Status: DC | PRN

## 2022-01-13 MED ORDER — CETIRIZINE HCL 10 MG PO TABS: 10.0000 mg | ORAL_TABLET | Freq: Every day | ORAL | 0 refills | Status: DC

## 2022-01-13 MED ORDER — AMOXICILLIN-POT CLAVULANATE 875-125 MG PO TABS: 1.0000 | ORAL_TABLET | Freq: Two times a day (BID) | ORAL | 0 refills | Status: DC

## 2022-01-13 NOTE — ED Provider Notes (Addendum)
[facility]  Note:  This document was prepared using Conservation officer, historic buildings and may include unintentional dictation errors.  MRN: [id number] DOB: 16-Apr-1991  Subjective:   Taylor Mcneil is a 31 y.o. male presenting for 8-day history of acute onset persistent sinus pressure, sinus headaches, persistent coughing, sinus congestion.  Start to have a fever in the past few days.  No chest pain, shortness of breath or wheezing.  Has a history of allergic rhinitis but does not take anything consistently for this.  No current medications.  No Known Allergies  Past Medical History:  Diagnosis Date   ADD (attention deficit disorder)    No pertinent past medical history      Past Surgical History:  Procedure Laterality Date   UMBILICAL HERNIA REPAIR  2017    Family History  Problem Relation Age of Onset   Lung cancer Paternal Aunt        smoker   Lung cancer Paternal Grandmother        smoker   Lung cancer Paternal Grandfather        smoker   Colon cancer Neg Hx    Prostate cancer Neg Hx     Social History   Tobacco Use   Smoking status: Never   Smokeless tobacco: Never  Substance Use Topics   Alcohol use: Never   Drug use: Never    ROS   Objective:   Vitals: BP 127/82   Pulse 77   Temp 98.1 F (36.7 C)   Resp 18   Ht 6\' 1"  (1.854 m)   Wt 205 lb (93 kg)   SpO2 97%   BMI 27.05 kg/m   Physical Exam Constitutional:      General: He is not in acute distress.    Appearance: Normal appearance. He is well-developed and normal weight. He is not ill-appearing, toxic-appearing or diaphoretic.  HENT:     Head: Normocephalic and atraumatic.     Right Ear: Tympanic membrane, ear canal and external ear normal. There is no impacted cerumen.     Left Ear: Tympanic membrane, ear canal and external ear normal. There is no impacted cerumen.     Nose: Congestion present. No rhinorrhea.     Comments: Nasal mucosa boggy and erythematous.     Mouth/Throat:     Mouth: Mucous membranes are moist.     Pharynx: No oropharyngeal exudate or posterior oropharyngeal erythema.  Eyes:     General: No scleral icterus.       Right eye: No discharge.        Left eye: No discharge.     Extraocular Movements: Extraocular movements intact.     Conjunctiva/sclera: Conjunctivae normal.  Cardiovascular:     Rate and Rhythm: Normal rate and regular rhythm.     Heart sounds: Normal heart sounds. No murmur heard.    No friction rub. No gallop.  Pulmonary:     Effort: Pulmonary effort is normal. No respiratory distress.     Breath sounds: Normal breath sounds. No stridor. No wheezing, rhonchi or rales.  Musculoskeletal:     Cervical back: Normal range of motion and neck supple. No rigidity. No muscular tenderness.  Neurological:     General: No focal deficit present.     Mental Status: He is alert and oriented to person, place, and time.     Cranial Nerves: No cranial nerve deficit.     Motor: No weakness.     Coordination: Coordination normal.  Gait: Gait normal.  Psychiatric:        Mood and Affect: Mood normal.        Behavior: Behavior normal.        Thought Content: Thought content normal.     Assessment and Plan :   PDMP not reviewed this encounter.  1. Acute non-recurrent sinusitis of other sinus   2. Acute cough   3. Sinus headache   4. Sinus pressure   5. Post-nasal drainage   6. Allergic rhinitis, unspecified seasonality, unspecified trigger     Will start empiric treatment for sinusitis with Augmentin.  Recommended supportive care otherwise including the use of Zyrtec long-term, Sudafed as needed. Counseled patient on potential for adverse effects with medications prescribed/recommended today, ER and return-to-clinic precautions discussed, patient verbalized understanding.   Jaynee Eagles, PA-C 01/13/22 1139

## 2022-01-13 NOTE — ED Triage Notes (Signed)
Patient to Urgent Care with complaints of headache, reports HA is behind his eyes and worse at night, productive cough (clear sputum), congestion since 9/14. Reports fever this morning.

## 2022-02-02 ENCOUNTER — Other Ambulatory Visit: Payer: Self-pay | Admitting: Family Medicine

## 2022-02-03 NOTE — Telephone Encounter (Signed)
LVM for patient to call and schedule

## 2022-02-03 NOTE — Telephone Encounter (Signed)
Refill request for methylphenidate (RITALIN) 20 MG tablet  LOV - 10/08/20 Next OV - not scheduled Last refill - 04/06/21 #60/0

## 2022-02-03 NOTE — Telephone Encounter (Signed)
Patient is overdue for CPE/med f/u. Please call to schedule appt

## 2022-02-03 NOTE — Telephone Encounter (Signed)
Pt called & scheduled cpe for 02/17/22. Pt is asking when will the meds be refilled?

## 2022-02-05 MED ORDER — METHYLPHENIDATE HCL 20 MG PO TABS: 10.0000 mg | ORAL_TABLET | Freq: Two times a day (BID) | ORAL | 0 refills | Status: DC

## 2022-02-05 NOTE — Telephone Encounter (Signed)
Sent. Thanks.   

## 2022-02-06 ENCOUNTER — Emergency Department
Admission: EM | Admit: 2022-02-06 | Discharge: 2022-02-06 | Disposition: A | Discharge status: Home / Self Care | Attending: Emergency Medicine | Admitting: Emergency Medicine

## 2022-02-06 ENCOUNTER — Emergency Department

## 2022-02-06 ENCOUNTER — Ambulatory Visit: Admission: EM | Admit: 2022-02-06 | Discharge: 2022-02-06 | Disposition: A | Discharge status: Home / Self Care

## 2022-02-06 ENCOUNTER — Other Ambulatory Visit: Payer: Self-pay

## 2022-02-06 DIAGNOSIS — J324 Chronic pansinusitis: Secondary | ICD-10-CM | POA: Diagnosis not present

## 2022-02-06 DIAGNOSIS — J40 Bronchitis, not specified as acute or chronic: Secondary | ICD-10-CM | POA: Diagnosis not present

## 2022-02-06 DIAGNOSIS — R519 Headache, unspecified: Secondary | ICD-10-CM | POA: Diagnosis present

## 2022-02-06 MED ORDER — ALBUTEROL SULFATE HFA 108 (90 BASE) MCG/ACT IN AERS: 2.0000 | INHALATION_SPRAY | Freq: Four times a day (QID) | RESPIRATORY_TRACT | 2 refills | Status: AC | PRN

## 2022-02-06 MED ORDER — IPRATROPIUM-ALBUTEROL 0.5-2.5 (3) MG/3ML IN SOLN
3.0000 mL | Freq: Once | RESPIRATORY_TRACT | Status: AC
  Administered 2022-02-06: 3 mL via RESPIRATORY_TRACT
  Filled 2022-02-06: qty 3

## 2022-02-06 MED ORDER — PREDNISONE 50 MG PO TABS: 50.0000 mg | ORAL_TABLET | Freq: Every day | ORAL | 0 refills | Status: DC

## 2022-02-06 MED ORDER — AMOXICILLIN-POT CLAVULANATE 875-125 MG PO TABS: 1.0000 | ORAL_TABLET | Freq: Two times a day (BID) | ORAL | 0 refills | Status: AC

## 2022-02-06 NOTE — ED Notes (Signed)
Patient discharged to home per MD order. Patient in stable condition, and deemed medically cleared by ED provider for discharge. Discharge instructions reviewed with patient/family using "Teach Back"; verbalized understanding of medication education and administration, and information about follow-up care. Denies further concerns. ° °

## 2022-02-06 NOTE — ED Triage Notes (Signed)
Pt in with co headache x 4 weeks. States seen for the same and tx with antibiotics for sinus infections without relief.

## 2022-02-06 NOTE — ED Provider Notes (Signed)
[facility] Provider Note    Event Date/Time   First MD Initiated Contact with Patient 02/06/22 1051     (approximate)   History   Headache   HPI  Taylor Mcneil is a 31 y.o. male   who presents with complaints of a headache for over 1 month, he reports it never fully goes away, he reports it is frontal in nature.  Denies any injury to the area.  Was treated for sinusitis several weeks ago with no improvement.  He also complains of a significant cough with some shortness of breath and wheezing.  No neurodeficits      Physical Exam   Triage Vital Signs: ED Triage Vitals  Enc Vitals Group     BP 02/06/22 1056 135/75     Pulse Rate 02/06/22 1056 88     Resp 02/06/22 1056 18     Temp 02/06/22 1056 98 F (36.7 C)     Temp Source 02/06/22 1056 Oral     SpO2 02/06/22 1056 99 %     Weight 02/06/22 1057 89.8 kg (198 lb)     Height 02/06/22 1057 1.854 m (6\' 1" )     Head Circumference --      Peak Flow --      Pain Score 02/06/22 1056 5     Pain Loc --      Pain Edu? --      Excl. in [place]? --     Most recent vital signs: Vitals:   02/06/22 1056  BP: 135/75  Pulse: 88  Resp: 18  Temp: 98 F (36.7 C)  SpO2: 99%     General: Awake, no distress.  CV:  Good peripheral perfusion.  Resp:  Normal effort.  Scattered mild wheezing Abd:  No distention.  Other:  Cranial nerves II through XII are normal, PERRLA, EOMI   ED Results / Procedures / Treatments   Labs (all labs ordered are listed, but only abnormal results are displayed) Labs Reviewed - No data to display   EKG     RADIOLOGY Chest x-ray viewed and interpreted by me, no acute abnormality    PROCEDURES:  Critical Care performed:   Procedures   MEDICATIONS ORDERED IN ED: Medications  ipratropium-albuterol (DUONEB) 0.5-2.5 (3) MG/3ML nebulizer solution 3 mL (3 mLs Nebulization Given 02/06/22 1114)     IMPRESSION / MDM / ASSESSMENT AND PLAN / ED COURSE  I reviewed the  triage vital signs and the nursing notes. Patient's presentation is most consistent with acute presentation with potential threat to life or bodily function.   Patient presents with daily headache as noted above, no neurodeficits however given chronicity will send for CT imaging.  Will likely need neurology follow-up  Cough and wheezing: Differential includes bronchitis, pneumonia, undiagnosed asthma  I am suspicious for bronchitis, will obtain chest x-ray, treat with DuoNebs and reevaluate.  Chest x-ray reassuring.  CT head demonstrates paranasal sinusitis which is severe, will start patient on Augmentin and follow-up with ENT given chronicity       FINAL CLINICAL IMPRESSION(S) / ED DIAGNOSES   Final diagnoses:  Chronic pansinusitis  Bronchitis     Rx / DC Orders   ED Discharge Orders          Ordered    amoxicillin-clavulanate (AUGMENTIN) 875-125 MG tablet  2 times daily        02/06/22 1211    predniSONE (DELTASONE) 50 MG tablet  Daily with breakfast  02/06/22 1211    albuterol (VENTOLIN HFA) 108 (90 Base) MCG/ACT inhaler  Every 6 hours PRN        02/06/22 1211             Note:  This document was prepared using Dragon voice recognition software and may include unintentional dictation errors.   Lavonia Drafts, MD 02/06/22 1315

## 2022-02-08 ENCOUNTER — Other Ambulatory Visit: Payer: Self-pay | Admitting: Family Medicine

## 2022-02-08 DIAGNOSIS — F988 Other specified behavioral and emotional disorders with onset usually occurring in childhood and adolescence: Secondary | ICD-10-CM

## 2022-02-10 ENCOUNTER — Other Ambulatory Visit (INDEPENDENT_AMBULATORY_CARE_PROVIDER_SITE_OTHER)

## 2022-02-10 DIAGNOSIS — F988 Other specified behavioral and emotional disorders with onset usually occurring in childhood and adolescence: Secondary | ICD-10-CM

## 2022-02-10 LAB — BASIC METABOLIC PANEL
BUN: 16 mg/dL (ref 6–23)
CO2: 31 mEq/L (ref 19–32)
Calcium: 9.5 mg/dL (ref 8.4–10.5)
Chloride: 102 mEq/L (ref 96–112)
Creatinine, Ser: 0.98 mg/dL (ref 0.40–1.50)
GFR: 104.39 mL/min (ref >60.00)
Glucose, Bld: 91 mg/dL (ref 70–99)
Potassium: 4.4 mEq/L (ref 3.5–5.1)
Sodium: 140 mEq/L (ref 135–145)

## 2022-02-10 LAB — TSH: TSH: 3.44 u[IU]/mL (ref 0.35–5.50)

## 2022-02-17 ENCOUNTER — Encounter: Admitting: Family Medicine

## 2022-03-14 ENCOUNTER — Encounter: Admitting: Family Medicine

## 2022-03-23 ENCOUNTER — Ambulatory Visit (INDEPENDENT_AMBULATORY_CARE_PROVIDER_SITE_OTHER): Admitting: Family Medicine

## 2022-03-23 ENCOUNTER — Encounter: Payer: Self-pay | Admitting: Family Medicine

## 2022-03-23 VITALS — BP 110/62 | HR 86 | Temp 97.7°F | Ht 73.0 in | Wt 197.0 lb

## 2022-03-23 DIAGNOSIS — Z23 Encounter for immunization: Secondary | ICD-10-CM

## 2022-03-23 DIAGNOSIS — Z0000 Encounter for general adult medical examination without abnormal findings: Secondary | ICD-10-CM | POA: Diagnosis not present

## 2022-03-23 DIAGNOSIS — F988 Other specified behavioral and emotional disorders with onset usually occurring in childhood and adolescence: Secondary | ICD-10-CM

## 2022-03-23 DIAGNOSIS — Z7189 Other specified counseling: Secondary | ICD-10-CM

## 2022-03-23 MED ORDER — METHYLPHENIDATE HCL 20 MG PO TABS: 10.0000 mg | ORAL_TABLET | Freq: Two times a day (BID) | ORAL | 0 refills | Status: AC

## 2022-03-23 NOTE — Patient Instructions (Signed)
Thanks for your effort.  Take care.  Glad to see you. Update me as needed.   

## 2022-03-23 NOTE — Progress Notes (Signed)
CPE- See plan.  Routine anticipatory guidance given to patient.  See health maintenance.  The possibility exists that previously documented standard health maintenance information may have been brought forward from a previous encounter into this note.  If needed, that same information has been updated to reflect the current situation based on today's encounter.    Tetanus 05/2017 Flu 2023 PNA and shingles not due Prostate and colon cancer screening not due.  Living will d/w pt.  Wife designated if patient were incapacitated.  Diet and exercise d/w pt, doing well with both.   [facility] per patient report.   ADD.  Taking adderall on school days. No ADE on med.  It helped with concentration.  In school at [employer]. Will graduate in 2024.  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    PMH and SH reviewed Meds, vitals, and allergies reviewed.   ROS: Per HPI.  Unless specifically indicated otherwise in HPI, the patient denies:  General: fever. Eyes: acute vision changes ENT: sore throat Cardiovascular: chest pain Respiratory: SOB GI: vomiting GU: dysuria Musculoskeletal: acute back pain Derm: acute rash Neuro: acute motor dysfunction Psych: worsening mood Endocrine: polydipsia Heme: bleeding Allergy: hayfever  GEN: nad, alert and oriented HEENT: ncat NECK: supple w/o LA CV: rrr. PULM: ctab, no inc wob ABD: soft, +bs EXT: no edema SKIN: no acute rash

## 2022-03-26 NOTE — Assessment & Plan Note (Signed)
Tetanus 05/2017 Flu 2023 PNA and shingles not due Prostate and colon cancer screening not due.  Living will d/w pt.  Wife designated if patient were incapacitated.  Diet and exercise d/w pt, doing well with both.   [facility] per patient report.

## 2022-03-26 NOTE — Assessment & Plan Note (Signed)
Continue Adderall as is.  He will update me as needed.

## 2022-03-26 NOTE — Assessment & Plan Note (Signed)
Living will d/w pt.  Wife designated if patient were incapacitated.   ?

## 2022-05-08 ENCOUNTER — Telehealth: Admitting: Nurse Practitioner

## 2022-05-08 DIAGNOSIS — M542 Cervicalgia: Secondary | ICD-10-CM | POA: Insufficient documentation

## 2022-05-08 DIAGNOSIS — M545 Low back pain, unspecified: Secondary | ICD-10-CM | POA: Diagnosis not present

## 2022-05-08 DIAGNOSIS — E7800 Pure hypercholesterolemia, unspecified: Secondary | ICD-10-CM | POA: Insufficient documentation

## 2022-05-08 DIAGNOSIS — R748 Abnormal levels of other serum enzymes: Secondary | ICD-10-CM | POA: Insufficient documentation

## 2022-05-08 DIAGNOSIS — R6882 Decreased libido: Secondary | ICD-10-CM | POA: Insufficient documentation

## 2022-05-08 MED ORDER — CYCLOBENZAPRINE HCL 10 MG PO TABS: 10.0000 mg | ORAL_TABLET | Freq: Three times a day (TID) | ORAL | 0 refills | Status: DC | PRN

## 2022-05-08 MED ORDER — NAPROXEN 500 MG PO TABS: 500.0000 mg | ORAL_TABLET | Freq: Two times a day (BID) | ORAL | 0 refills | Status: AC

## 2022-05-08 NOTE — Progress Notes (Signed)
We are sorry that you are not feeling well.  Here is how we plan to help!  Based on what you have shared with me it looks like you mostly have acute back pain.  Acute back pain is defined as musculoskeletal pain that can resolve in 1-3 weeks with conservative treatment.  I have prescribed Naprosyn 500 mg take one by mouth twice a day non-steroid anti-inflammatory (NSAID) as well as Flexeril 10 mg every eight hours as needed which is a muscle relaxer  Some patients experience stomach irritation or in increased heartburn with anti-inflammatory drugs.  Please keep in mind that muscle relaxer's can cause fatigue and should not be taken while at work or driving.  Back pain is very common.  The pain often gets better over time.  The cause of back pain is usually not dangerous.  Most people can learn to manage their back pain on their own.  Home Care Stay active.  Start with short walks on flat ground if you can.  Try to walk farther each day. Do not sit, drive or stand in one place for more than 30 minutes.  Do not stay in bed. Do not avoid exercise or work.  Activity can help your back heal faster. Be careful when you bend or lift an object.  Bend at your knees, keep the object close to you, and do not twist. Sleep on a firm mattress.  Lie on your side, and bend your knees.  If you lie on your back, put a pillow under your knees. Only take medicines as told by your doctor. Put ice on the injured area. Put ice in a plastic bag Place a towel between your skin and the bag Leave the ice on for 15-20 minutes, 3-4 times a day for the first 2-3 days. 210 After that, you can switch between ice and heat packs. Ask your doctor about back exercises or massage. Avoid feeling anxious or stressed.  Find good ways to deal with stress, such as exercise.  Get Help Right Way If: Your pain does not go away with rest or medicine. Your pain does not go away in 1 week. You have new problems. You do not feel  well. The pain spreads into your legs. You cannot control when you poop (bowel movement) or pee (urinate) You feel sick to your stomach (nauseous) or throw up (vomit) You have belly (abdominal) pain. You feel like you may pass out (faint). If you develop a fever.  Make Sure you: Understand these instructions. Will watch your condition Will get help right away if you are not doing well or get worse.  Your e-visit answers were reviewed by a board certified advanced clinical practitioner to complete your personal care plan.  Depending on the condition, your plan could have included both over the counter or prescription medications.  If there is a problem please reply  once you have received a response from your provider.  Your safety is important to us.  If you have drug allergies check your prescription carefully.    You can use MyChart to ask questions about today's visit, request a non-urgent call back, or ask for a work or school excuse for 24 hours related to this e-Visit. If it has been greater than 24 hours you will need to follow up with your provider, or enter a new e-Visit to address those concerns.  You will get an e-mail in the next two days asking about your experience.  I hope that   your e-visit has been valuable and will speed your recovery. Thank you for using e-visits.   Meds ordered this encounter  Medications   naproxen (NAPROSYN) 500 MG tablet    Sig: Take 1 tablet (500 mg total) by mouth 2 (two) times daily with a meal for 7 days.    Dispense:  14 tablet    Refill:  0   cyclobenzaprine (FLEXERIL) 10 MG tablet    Sig: Take 1 tablet (10 mg total) by mouth 3 (three) times daily as needed for muscle spasms.    Dispense:  30 tablet    Refill:  0     I spent approximately 5 minutes reviewing the patient's history, current symptoms and coordinating their care today.

## 2022-06-01 ENCOUNTER — Emergency Department
Admission: EM | Admit: 2022-06-01 | Discharge: 2022-06-01 | Disposition: A | Discharge status: Home / Self Care | Attending: Emergency Medicine | Admitting: Emergency Medicine

## 2022-06-01 ENCOUNTER — Other Ambulatory Visit: Payer: Self-pay

## 2022-06-01 DIAGNOSIS — M62830 Muscle spasm of back: Secondary | ICD-10-CM | POA: Insufficient documentation

## 2022-06-01 DIAGNOSIS — M545 Low back pain, unspecified: Secondary | ICD-10-CM | POA: Diagnosis present

## 2022-06-01 LAB — URINALYSIS, ROUTINE W REFLEX MICROSCOPIC
Bilirubin Urine: NEGATIVE
Glucose, UA: NEGATIVE mg/dL
Hgb urine dipstick: NEGATIVE
Ketones, ur: NEGATIVE mg/dL
Leukocytes,Ua: NEGATIVE
Nitrite: NEGATIVE
Protein, ur: NEGATIVE mg/dL
Specific Gravity, Urine: 1.025 (ref 1.005–1.030)
pH: 5 (ref 5.0–8.0)

## 2022-06-01 MED ORDER — METHOCARBAMOL 500 MG PO TABS: ORAL_TABLET | ORAL | 0 refills | Status: AC

## 2022-06-01 MED ORDER — KETOROLAC TROMETHAMINE 30 MG/ML IJ SOLN
30.0000 mg | Freq: Once | INTRAMUSCULAR | Status: AC
  Administered 2022-06-01: 30 mg via INTRAMUSCULAR
  Filled 2022-06-01: qty 1

## 2022-06-01 MED ORDER — ETODOLAC 400 MG PO TABS: 400.0000 mg | ORAL_TABLET | Freq: Two times a day (BID) | ORAL | 0 refills | Status: AC

## 2022-06-01 NOTE — ED Triage Notes (Signed)
Pt states he has lower back pain, pt denies numbness or injury noted.

## 2022-06-01 NOTE — Discharge Instructions (Signed)
Follow-up with your primary care provider if any continued problems or concerns.  A prescription for etodolac 400 mg twice daily with food was sent to the pharmacy along with a prescription for methocarbamol which is a muscle relaxant.  This medication is 1 or 2 every 6 hours as needed for muscle spasms and should not be taken if you plan on driving or operating machinery as it could cause drowsiness.  You may also use ice or heat to your back as needed.

## 2022-06-01 NOTE — ED Provider Notes (Signed)
[facility] Provider Note    Event Date/Time   First MD Initiated Contact with Patient 06/01/22 1201     (approximate)   History   Back Pain   HPI  Taylor Mcneil is a 32 y.o. male   presents to the ED with complaint of low back pain for the last couple days without known history of injury.  Patient is unaware of any urinary symptoms and denies prior history of kidney stones.  Pain is worse with movement.  Patient denies any over-the-counter medications taken.      Physical Exam   Triage Vital Signs: ED Triage Vitals  Enc Vitals Group     BP 06/01/22 1220 123/87     Pulse Rate 06/01/22 1220 82     Resp 06/01/22 1220 16     Temp 06/01/22 1220 97.7 F (36.5 C)     Temp Source 06/01/22 1220 Oral     SpO2 06/01/22 1220 98 %     Weight --      Height --      Head Circumference --      Peak Flow --      Pain Score 06/01/22 1130 7     Pain Loc --      Pain Edu? --      Excl. in [place]? --     Most recent vital signs: Vitals:   06/01/22 1220  BP: 123/87  Pulse: 82  Resp: 16  Temp: 97.7 F (36.5 C)  SpO2: 98%     General: Awake, no distress.  CV:  Good peripheral perfusion.  Resp:  Normal effort.  Abd:  No distention.  Other:  On examination of the back there is no gross deformity and no point tenderness on palpation of the thoracic or lumbar spine.  There is tenderness on palpation of the right lower paravertebral muscles in the L5-S1 and sacral area.  Range of motion is slow and guarded.  Straight leg raises are approximately 40 degrees with discomfort bilaterally.  Reflexes are 2+ bilaterally.  Muscle strength bilaterally.     ED Results / Procedures / Treatments   Labs (all labs ordered are listed, but only abnormal results are displayed) Labs Reviewed  URINALYSIS, ROUTINE W REFLEX MICROSCOPIC - Abnormal; Notable for the following components:      Result Value   Color, Urine YELLOW (*)    APPearance CLEAR (*)    All other  components within normal limits      PROCEDURES:  Critical Care performed:   Procedures   MEDICATIONS ORDERED IN ED: Medications  ketorolac (TORADOL) 30 MG/ML injection 30 mg (30 mg Intramuscular Given 06/01/22 1246)     IMPRESSION / MDM / [place] / ED COURSE  I reviewed the triage vital signs and the nursing notes.   Differential diagnosis includes, but is not limited to, muscle skeletal pain, lumbar strain, urolithiasis, urinary tract infection.  32 year old male presents to the ED with complaint of right lower back pain without known history of injury.  Patient on exam was slow to get up from a supine position to sitting position.  Remainder of the exam was negative.  Patient drove himself to the emergency department therefore he is aware that he would be getting a Toradol injection.  Urinalysis was negative and patient was reassured.  He states that the injection has helped ease some of his discomfort.  We discussed use of etodolac 400 mg and muscle relaxants and to  titrate the methocarbamol to 1 or 2 every 6 hours as needed for muscle spasms.  He is encouraged to use ice or heat to his back as needed for discomfort and follow-up with his PCP if any continued problems.      Patient's presentation is most consistent with acute complicated illness / injury requiring diagnostic workup.  FINAL CLINICAL IMPRESSION(S) / ED DIAGNOSES   Final diagnoses:  Muscle spasm of back     Rx / DC Orders   ED Discharge Orders          Ordered    methocarbamol (ROBAXIN) 500 MG tablet        06/01/22 1436    etodolac (LODINE) 400 MG tablet  2 times daily        06/01/22 1436             Note:  This document was prepared using Dragon voice recognition software and may include unintentional dictation errors.   Johnn Hai, PA-C 06/01/22 1444    Carrie Mew, MD 06/01/22 [phone]

## 2022-06-05 ENCOUNTER — Telehealth: Payer: Self-pay

## 2022-06-05 NOTE — Transitions of Care (Post Inpatient/ED Visit) (Signed)
   06/05/2022  Name: Taylor Mcneil MRN: [id number] DOB: 1990-11-17  Today's TOC FU Call Status: Today's TOC FU Call Status:: Successful TOC FU Call Competed TOC FU Call Complete Date: 06/05/22  Transition Care Management Follow-up Telephone Call Date of Discharge: 06/01/22 Discharge Facility: [facility] Type of Discharge: Emergency Department How have you been since you were released from the hospital?: Better Any questions or concerns?: No  Items Reviewed: Did you receive and understand the discharge instructions provided?: Yes Medications obtained and verified?: Yes (Medications Reviewed) Any new allergies since your discharge?: No Dietary orders reviewed?: NA Do you have support at home?: Yes  Home Care and Equipment/Supplies: [place] Ordered?: NA Any new equipment or medical supplies ordered?: NA  Functional Questionnaire: Do you need assistance with bathing/showering or dressing?: No Do you need assistance with meal preparation?: No Do you need assistance with eating?: No Do you have difficulty maintaining continence: No Do you need assistance with getting out of bed/getting out of a chair/moving?: No Do you have difficulty managing or taking your medications?: No  Folllow up appointments reviewed: PCP Follow-up appointment confirmed?: NA Specialist Hospital Follow-up appointment confirmed?: NA Do you need transportation to your follow-up appointment?: No Do you understand care options if your condition(s) worsen?: Yes-patient verbalized understanding    [place] LPN [place] [phone]

## 2022-07-20 ENCOUNTER — Telehealth: Admitting: Physician Assistant

## 2022-07-20 DIAGNOSIS — J029 Acute pharyngitis, unspecified: Secondary | ICD-10-CM

## 2022-07-20 MED ORDER — PREDNISONE 20 MG PO TABS: 40.0000 mg | ORAL_TABLET | Freq: Every day | ORAL | 0 refills | Status: AC

## 2022-07-20 NOTE — Progress Notes (Signed)

## 2022-07-20 NOTE — Addendum Note (Signed)
Addended by: Brunetta Jeans on: 07/20/2022 08:37 AM   Modules accepted: Orders

## 2022-07-20 NOTE — Progress Notes (Signed)
I have spent 5 minutes in review of e-visit questionnaire, review and updating patient chart, medical decision making and response to patient.   Darwin Guastella Cody Kabrina Christiano, PA-C    

## 2022-09-24 ENCOUNTER — Telehealth: Admitting: Physician Assistant

## 2022-09-24 DIAGNOSIS — B9789 Other viral agents as the cause of diseases classified elsewhere: Secondary | ICD-10-CM

## 2022-09-24 DIAGNOSIS — J329 Chronic sinusitis, unspecified: Secondary | ICD-10-CM | POA: Diagnosis not present

## 2022-09-24 MED ORDER — FLUTICASONE PROPIONATE 50 MCG/ACT NA SUSP: 2.0000 | Freq: Every day | NASAL | 6 refills | Status: AC

## 2022-09-24 NOTE — Progress Notes (Signed)
E-Visit for Sinus Problems  We are sorry that you are not feeling well.  Here is how we plan to help!  Based on what you have shared with me it looks like you have sinusitis.  Sinusitis is inflammation and infection in the sinus cavities of the head.  Based on your presentation I believe you most likely have Acute Viral Sinusitis.This is an infection most likely caused by a virus. There is not specific treatment for viral sinusitis other than to help you with the symptoms until the infection runs its course.  You may use an oral decongestant such as Mucinex D or if you have glaucoma or high blood pressure use plain Mucinex. Saline nasal spray help and can safely be used as often as needed for congestion, I have prescribed: Fluticasone nasal spray two sprays in each nostril once a day  Some authorities believe that zinc sprays or the use of Echinacea may shorten the course of your symptoms.  Sinus infections are not as easily transmitted as other respiratory infection, however we still recommend that you avoid close contact with loved ones, especially the very young and elderly.  Remember to wash your hands thoroughly throughout the day as this is the number one way to prevent the spread of infection!  Home Care: Only take medications as instructed by your medical team. Do not take these medications with alcohol. A steam or ultrasonic humidifier can help congestion.  You can place a towel over your head and breathe in the steam from hot water coming from a faucet. Avoid close contacts especially the very young and the elderly. Cover your mouth when you cough or sneeze. Always remember to wash your hands.  Get Help Right Away If: You develop worsening fever or sinus pain. You develop a severe head ache or visual changes. Your symptoms persist after you have completed your treatment plan.  Make sure you Understand these instructions. Will watch your condition. Will get help right away if you  are not doing well or get worse.   Thank you for choosing an e-visit.  Your e-visit answers were reviewed by a board certified advanced clinical practitioner to complete your personal care plan. Depending upon the condition, your plan could have included both over the counter or prescription medications.  Please review your pharmacy choice. Make sure the pharmacy is open so you can pick up prescription now. If there is a problem, you may contact your provider through MyChart messaging and have the prescription routed to another pharmacy.  Your safety is important to us. If you have drug allergies check your prescription carefully.   For the next 24 hours you can use MyChart to ask questions about today's visit, request a non-urgent call back, or ask for a work or school excuse. You will get an email in the next two days asking about your experience. I hope that your e-visit has been valuable and will speed your recovery.  I have spent 5 minutes in review of e-visit questionnaire, review and updating patient chart, medical decision making and response to patient.   Reice Bienvenue Z Ward, PA-C      

## 2022-12-12 ENCOUNTER — Emergency Department (HOSPITAL_COMMUNITY)

## 2022-12-12 ENCOUNTER — Other Ambulatory Visit: Payer: Self-pay

## 2022-12-12 ENCOUNTER — Emergency Department (HOSPITAL_COMMUNITY)
Admission: EM | Admit: 2022-12-12 | Discharge: 2022-12-12 | Disposition: A | Discharge status: Home / Self Care | Payer: Worker's Compensation | Attending: Emergency Medicine | Admitting: Emergency Medicine

## 2022-12-12 DIAGNOSIS — M79671 Pain in right foot: Secondary | ICD-10-CM | POA: Diagnosis present

## 2022-12-12 DIAGNOSIS — W1841XA Slipping, tripping and stumbling without falling due to stepping on object, initial encounter: Secondary | ICD-10-CM | POA: Diagnosis not present

## 2022-12-12 DIAGNOSIS — Y990 Civilian activity done for income or pay: Secondary | ICD-10-CM | POA: Insufficient documentation

## 2022-12-12 NOTE — ED Triage Notes (Signed)
Pt c/o right foot pain states he stepped on a rock at work yesterday and has been hurting since.

## 2022-12-12 NOTE — ED Provider Notes (Signed)
[place] EMERGENCY DEPARTMENT AT [facility] Provider Note   CSN: [id number] Arrival date & time: 12/12/22  5638    History  Chief Complaint  Patient presents with   Foot Pain    Taylor Mcneil is a 32 y.o. male who presents emergency department concerns for right foot pain onset yesterday.  Notes that he was at work when he stepped on a rock.  He did not try any medications at home for his symptoms.  Denies hitting his head or LOC.  Denies swelling to the area.  The history is provided by the patient. No language interpreter was used.       Home Medications Prior to Admission medications   Medication Sig Start Date End Date Taking? Authorizing Provider  albuterol (VENTOLIN HFA) 108 (90 Base) MCG/ACT inhaler Inhale 2 puffs into the lungs every 6 (six) hours as needed for wheezing or shortness of breath. 02/06/22   Jene Every, MD  etodolac (LODINE) 400 MG tablet Take 1 tablet (400 mg total) by mouth 2 (two) times daily. 06/01/22   Tommi Rumps, PA-C  fluticasone (FLONASE) 50 MCG/ACT nasal spray Place 2 sprays into both nostrils daily. 09/24/22   Ward, Tylene Fantasia, PA-C  methocarbamol (ROBAXIN) 500 MG tablet 1-2 tablets every 6 hours as needed for muscle spasms. 06/01/22   Tommi Rumps, PA-C  methylphenidate (RITALIN) 20 MG tablet Take 0.5-1 tablets (10-20 mg total) by mouth 2 (two) times daily. Fill on/after 04/22/22 03/23/22   Joaquim Nam, MD  methylphenidate (RITALIN) 20 MG tablet Take 0.5-1 tablets (10-20 mg total) by mouth 2 (two) times daily. 03/23/22   Joaquim Nam, MD  methylphenidate (RITALIN) 20 MG tablet Take 0.5-1 tablets (10-20 mg total) by mouth in the morning and at bedtime. 03/23/22   Joaquim Nam, MD  predniSONE (DELTASONE) 20 MG tablet Take 2 tablets (40 mg total) by mouth daily with breakfast. 07/20/22   Waldon Merl, PA-C      Allergies    Patient has no known allergies.    Review of Systems   Review of Systems  All other  systems reviewed and are negative.   Physical Exam Updated Vital Signs BP (!) 152/107 (BP Location: Left Arm)   Pulse (!) 57   Temp 97.6 F (36.4 C) (Oral)   Resp 18   Ht 6\' 1"  (1.854 m)   Wt 95.3 kg   SpO2 96%   BMI 27.71 kg/m  Physical Exam Vitals and nursing note reviewed.  Constitutional:      General: He is not in acute distress.    Appearance: Normal appearance.  Eyes:     General: No scleral icterus.    Extraocular Movements: Extraocular movements intact.  Cardiovascular:     Rate and Rhythm: Normal rate.  Pulmonary:     Effort: Pulmonary effort is normal. No respiratory distress.  Abdominal:     Palpations: Abdomen is soft. There is no mass.     Tenderness: There is no abdominal tenderness.  Musculoskeletal:        General: Normal range of motion.     Cervical back: Neck supple.     Comments: Pedal pulse intact.  No appreciable swelling, erythema, drainage, puncture, laceration, abrasion wound noted.  Able to flex and extend right foot against resistance.  Tenderness to palpation noted to right heel.  Skin:    General: Skin is warm and dry.     Findings: No rash.  Neurological:  Mental Status: He is alert.     Sensory: Sensation is intact.     Motor: Motor function is intact.  Psychiatric:        Behavior: Behavior normal.     ED Results / Procedures / Treatments   Labs (all labs ordered are listed, but only abnormal results are displayed) Labs Reviewed - No data to display  EKG None  Radiology DG Foot Complete Right  Result Date: 12/12/2022 CLINICAL DATA:  Right foot pain after stepping on a rock at work yesterday. Pain since that time. EXAM: RIGHT FOOT COMPLETE - 3+ VIEW COMPARISON:  None Available. FINDINGS: There is no evidence of fracture or dislocation. There is no evidence of arthropathy or other focal bone abnormality. Soft tissues are unremarkable. No soft tissue gas or radiopaque foreign body. IMPRESSION: Negative radiographs of the right  foot. Electronically Signed   By: Narda Rutherford M.D.   On: 12/12/2022 11:58    Procedures Procedures    Medications Ordered in ED Medications - No data to display  ED Course/ Medical Decision Making/ A&P Clinical Course as of 12/12/22 1208  Tue Dec 12, 2022  1202 Re-evaluated and discussed with patient imaging findings.  Discussed discharge treatment plan. Pt agreeable at this time. Pt appears safe for discharge. [SB]    Clinical Course User Index [SB] Marshall Roehrich A, PA-C                                 Medical Decision Making Amount and/or Complexity of Data Reviewed Radiology: ordered.   Patient with right foot pain onset yesterday stepping on a rock. Vital signs patient afebrile. On exam, patient with Pedal pulse intact.  No appreciable swelling, erythema, drainage, puncture, laceration, abrasion wound noted.  Able to flex and extend right foot against resistance.  Tenderness to palpation noted to right heel. Differential diagnosis includes effusion, fracture, dislocation, sprain, foreign body.   Imaging: I ordered imaging studies including right foot xray I independently visualized and interpreted imaging which showed: Number I agree with the radiologist interpretation   Disposition: Presenting suspicious for right foot pain status post stepping on a rock.  Doubt concerns at this time for fracture, dislocation, sprain, foreign body. After consideration of the diagnostic results and the patients response to treatment, I feel that the patient would benefit from Discharge home.  Patient provided with postop shoe today in the ED.  Offered crutches, patient declines at this time.  Offered work note, patient declined at this time.  Supportive care measures and strict return precautions discussed with patient at bedside. Pt acknowledges and verbalizes understanding. Pt appears safe for discharge. Follow up as indicated in discharge paperwork.    This chart was dictated using  voice recognition software, Dragon. Despite the best efforts of this provider to proofread and correct errors, errors may still occur which can change documentation meaning.   Final Clinical Impression(s) / ED Diagnoses Final diagnoses:  Right foot pain    Rx / DC Orders ED Discharge Orders     None         Brace Welte A, PA-C 12/12/22 1208    Gerhard Munch, MD 12/12/22 1524

## 2022-12-12 NOTE — Discharge Instructions (Signed)
It was a pleasure taking care of you!   Your x-ray was negative for fracture or dislocation.  You may take over the counter 600 mg Ibuprofen every 6 hours and alternate with 500 mg Tylenol every 6 hours as needed for pain for no more than 7 days.  You will be given a postop shoe today.  Wear during the day, you may remove it at night.  You may apply ice or heat to affected area for up to 15 minutes at a time. Ensure to place a barrier between your skin and the ice/heat. You may follow-up with your primary care provider as needed.  Return to the Emergency Department if you are experiencing increasing/worsening pain, swelling, color change, fever, or worsening symptoms.
